# Patient Record
Sex: Male | Born: 1965
Health system: Southern US, Community
[De-identification: ages and names within clinical notes are randomized; demographics above are authoritative.]

## PROBLEM LIST (undated history)

## (undated) DIAGNOSIS — E785 Hyperlipidemia, unspecified: Secondary | ICD-10-CM

## (undated) DIAGNOSIS — B019 Varicella without complication: Secondary | ICD-10-CM

## (undated) DIAGNOSIS — I82409 Acute embolism and thrombosis of unspecified deep veins of unspecified lower extremity: Secondary | ICD-10-CM

## (undated) HISTORY — DX: Hyperlipidemia, unspecified: E78.5

## (undated) HISTORY — PX: CIRCUMCISION: SUR203

## (undated) HISTORY — DX: Varicella without complication: B01.9

## (undated) HISTORY — DX: Acute embolism and thrombosis of unspecified deep veins of unspecified lower extremity: I82.409

## (undated) HISTORY — PX: APPENDECTOMY: SHX54

---

## 2003-06-20 ENCOUNTER — Emergency Department (HOSPITAL_COMMUNITY): Admission: EM | Admit: 2003-06-20 | Discharge: 2003-06-20 | Payer: Self-pay | Admitting: Emergency Medicine

## 2011-03-15 ENCOUNTER — Emergency Department (HOSPITAL_COMMUNITY)
Admission: EM | Admit: 2011-03-15 | Discharge: 2011-03-15 | Disposition: A | Discharge status: Home / Self Care | Payer: BC Managed Care – PPO | Attending: Emergency Medicine | Admitting: Emergency Medicine

## 2011-03-15 ENCOUNTER — Encounter: Payer: Self-pay | Admitting: *Deleted

## 2011-03-15 ENCOUNTER — Emergency Department (HOSPITAL_COMMUNITY): Payer: BC Managed Care – PPO

## 2011-03-15 DIAGNOSIS — I880 Nonspecific mesenteric lymphadenitis: Secondary | ICD-10-CM | POA: Insufficient documentation

## 2011-03-15 DIAGNOSIS — R1031 Right lower quadrant pain: Secondary | ICD-10-CM | POA: Insufficient documentation

## 2011-03-15 DIAGNOSIS — R110 Nausea: Secondary | ICD-10-CM | POA: Insufficient documentation

## 2011-03-15 DIAGNOSIS — R10813 Right lower quadrant abdominal tenderness: Secondary | ICD-10-CM | POA: Insufficient documentation

## 2011-03-15 LAB — DIFFERENTIAL
Basophils Absolute: 0 10*3/uL (ref 0.0–0.1)
Basophils Relative: 0 % (ref 0–1)
Eosinophils Absolute: 0.3 10*3/uL (ref 0.0–0.7)
Eosinophils Relative: 6 % — ABNORMAL HIGH (ref 0–5)
Lymphocytes Relative: 37 % (ref 12–46)
Lymphs Abs: 1.7 10*3/uL (ref 0.7–4.0)
Monocytes Absolute: 0.4 10*3/uL (ref 0.1–1.0)
Monocytes Relative: 9 % (ref 3–12)
Neutro Abs: 2.2 10*3/uL (ref 1.7–7.7)
Neutrophils Relative %: 48 % (ref 43–77)

## 2011-03-15 LAB — URINALYSIS, ROUTINE W REFLEX MICROSCOPIC
Bilirubin Urine: NEGATIVE
Glucose, UA: NEGATIVE mg/dL
Hgb urine dipstick: NEGATIVE
Ketones, ur: NEGATIVE mg/dL
Leukocytes, UA: NEGATIVE
Nitrite: NEGATIVE
Protein, ur: NEGATIVE mg/dL
Specific Gravity, Urine: 1.027 (ref 1.005–1.030)
Urobilinogen, UA: 0.2 mg/dL (ref 0.0–1.0)
pH: 6 (ref 5.0–8.0)

## 2011-03-15 LAB — HEPATIC FUNCTION PANEL
ALT: 17 U/L (ref 0–53)
AST: 16 U/L (ref 0–37)
Albumin: 3.7 g/dL (ref 3.5–5.2)
Alkaline Phosphatase: 47 U/L (ref 39–117)
Bilirubin, Direct: 0.2 mg/dL (ref 0.0–0.3)
Indirect Bilirubin: 0.6 mg/dL (ref 0.3–0.9)
Total Bilirubin: 0.8 mg/dL (ref 0.3–1.2)
Total Protein: 7.6 g/dL (ref 6.0–8.3)

## 2011-03-15 LAB — CBC
HCT: 47 % (ref 39.0–52.0)
Hemoglobin: 16.4 g/dL (ref 13.0–17.0)
MCH: 29 pg (ref 26.0–34.0)
MCHC: 34.9 g/dL (ref 30.0–36.0)
MCV: 83.2 fL (ref 78.0–100.0)
Platelets: 192 10*3/uL (ref 150–400)
RBC: 5.65 MIL/uL (ref 4.22–5.81)
RDW: 13.2 % (ref 11.5–15.5)
WBC: 4.6 10*3/uL (ref 4.0–10.5)

## 2011-03-15 LAB — BASIC METABOLIC PANEL
BUN: 13 mg/dL (ref 6–23)
CO2: 25 mEq/L (ref 19–32)
Calcium: 9.3 mg/dL (ref 8.4–10.5)
Chloride: 104 mEq/L (ref 96–112)
Creatinine, Ser: 1.11 mg/dL (ref 0.50–1.35)
GFR calc Af Amer: >90 mL/min (ref >90)
GFR calc non Af Amer: 80 mL/min — ABNORMAL LOW (ref >90)
Glucose, Bld: 91 mg/dL (ref 70–99)
Potassium: 3.9 mEq/L (ref 3.5–5.1)
Sodium: 139 mEq/L (ref 135–145)

## 2011-03-15 LAB — LIPASE, BLOOD: Lipase: 21 U/L (ref 11–59)

## 2011-03-15 MED ORDER — MORPHINE SULFATE 4 MG/ML IJ SOLN
4.0000 mg | Freq: Once | INTRAMUSCULAR | Status: AC
  Administered 2011-03-15: 4 mg via INTRAVENOUS
  Filled 2011-03-15: qty 1

## 2011-03-15 MED ORDER — ONDANSETRON HCL 4 MG/2ML IJ SOLN
4.0000 mg | Freq: Once | INTRAMUSCULAR | Status: AC
  Administered 2011-03-15: 4 mg via INTRAVENOUS
  Filled 2011-03-15: qty 2

## 2011-03-15 MED ORDER — HYDROCODONE-ACETAMINOPHEN 5-325 MG PO TABS: 1.0000 | ORAL_TABLET | Freq: Four times a day (QID) | ORAL | Status: AC | PRN

## 2011-03-15 MED ORDER — IOHEXOL 300 MG/ML  SOLN
100.0000 mL | Freq: Once | INTRAMUSCULAR | Status: AC | PRN
  Administered 2011-03-15: 100 mL via INTRAVENOUS

## 2011-03-15 NOTE — ED Notes (Signed)
Pt awaiting ct results. C/o rlq abd pain with nausea since Sunday. Pt states pain tolerable presently. Pt in no acute distress. A&ox3.

## 2011-03-15 NOTE — ED Notes (Signed)
Pt reports progressively worsening RLQ pain. Denies urinary symptoms. Pt reports pain increases with movements and pressing. Pt denies any fevers. Wife states pt has been very sluggish.

## 2011-03-15 NOTE — ED Notes (Signed)
Returned from ct 

## 2011-03-15 NOTE — ED Notes (Signed)
Report given to CDU. Pt in CT at present and will go to CDU after.

## 2011-03-15 NOTE — ED Provider Notes (Signed)
Patient care resumed from [facility].  Patient presented with right lower Cordran pain but denies fevers, night sweats, chills.  Patient's pain was being managed in the emergency department with morphine and was sent to the CDU was CT pended to rule out appendicitis.  CT results are listed below.  The patient does not have an appendix and there were no signs of acute emergent findings.  Patient discharged with findings that could indicate mesenteric adenitis.  Discussed findings of CT exam thoroughly with patient including the need to followup for his pulmonary nodules 7 mm in his right lung base.  Patient understands that it is recommended he followup for a chest CT in 6 months time.  Patient is currently hemodynamically in no acute distress and has no complaints.    IMPRESSION: There are. Postoperative changes of appendectomy. No inflammatory changes are demonstrated in the right lower quadrant. Mild prominence of nonpathologically enlarged mesenteric and right lower quadrant lymph nodes could reflect mesenteric adenitis although nonspecific. 7 mm indeterminate nodule in the right lung base. See follow-up recommendations above. Flattening of the inferior vena cava suggesting dehydration.  Original Report Authenticated By: Marlon Pel, M.D.         [place], [place] 03/15/11 2243

## 2011-03-15 NOTE — ED Provider Notes (Signed)
History     CSN: [id number]  Arrival date & time 03/15/11  1647   First MD Initiated Contact with Patient 03/15/11 1811      Chief Complaint  Patient presents with  . Abdominal Pain   Patient is a 46 y.o. male presenting with abdominal pain.  Abdominal Pain The primary symptoms of the illness include abdominal pain and nausea. The primary symptoms of the illness do not include fever, shortness of breath, vomiting, diarrhea or dysuria.  Symptoms associated with the illness do not include chills, diaphoresis, constipation, urgency, hematuria or back pain.   Patient presents to the emergency room with complaint of abdominal pain, RLQ that has been constant in nature for the past four days. Reports some associated nausea. No fevers. No vomiting. Normal bowel movement this morning, without any blood in his stool. Denies any diarrhea. Denies any pain similar to this. Denies any RUQ pain. Patient has had umbilical hernia repair surgery when he was younger, no recent abdominal surgeries. Denies any hernia, urethral discharge, or dysuria. Denies any trauma.   History reviewed. No pertinent past medical history.  History reviewed. No pertinent past surgical history.  History reviewed. No pertinent family history.  History  Substance Use Topics  . Smoking status: Never Smoker   . Smokeless tobacco: Not on file  . Alcohol Use: No      Review of Systems  Constitutional: Negative for fever, chills, diaphoresis and appetite change.  HENT: Negative for neck pain.   Eyes: Negative for photophobia and visual disturbance.  Respiratory: Negative for cough, chest tightness and shortness of breath.   Cardiovascular: Negative for chest pain.  Gastrointestinal: Positive for nausea and abdominal pain. Negative for vomiting, diarrhea, constipation, blood in stool, abdominal distention, anal bleeding and rectal pain.  Genitourinary: Negative for dysuria, urgency, hematuria, flank pain, decreased urine  volume, penile swelling, scrotal swelling, difficulty urinating and testicular pain.  Musculoskeletal: Negative for back pain.  Skin: Negative for rash.  Neurological: Negative for weakness and numbness.  All other systems reviewed and are negative.    Allergies  Review of patient's allergies indicates no known allergies.  Home Medications  No current outpatient prescriptions on file.  BP 114/75  Pulse 71  Temp(Src) 97.5 F (36.4 C) (Oral)  Resp 18  SpO2 98%  Physical Exam  Nursing note and vitals reviewed. Constitutional: He is oriented to person, place, and time. He appears well-developed and well-nourished. No distress.  HENT:  Head: Normocephalic and atraumatic.  Mouth/Throat: Oropharynx is clear and moist.  Eyes: EOM are normal. Pupils are equal, round, and reactive to light.  Neck: Normal range of motion. Neck supple.  Cardiovascular: Normal rate, regular rhythm, normal heart sounds and intact distal pulses.  Exam reveals no gallop and no friction rub.   No murmur heard. Pulmonary/Chest: Effort normal and breath sounds normal. No respiratory distress. He has no wheezes. He exhibits no tenderness.  Abdominal: Soft. Bowel sounds are normal. He exhibits no mass. There is tenderness. There is no rebound and no guarding.       RLQ pain, tender to palpation. Negative murphy's sign. Umbilical hernia repair scar.   Musculoskeletal: Normal range of motion. He exhibits no edema and no tenderness.  Neurological: He is alert and oriented to person, place, and time. He displays normal reflexes. No cranial nerve deficit or sensory deficit. He exhibits normal muscle tone. He displays a negative Romberg sign. Coordination and gait normal. GCS eye subscore is 4. GCS verbal subscore is  5. GCS motor subscore is 6. He displays no Babinski's sign on the right side. He displays no Babinski's sign on the left side.       Normal finger to nose testing. Normal grip. No pronator drift. No nystagmus  on examination.  Skin: Skin is warm and dry. No rash noted. He is not diaphoretic. No erythema. No pallor.  Psychiatric: He has a normal mood and affect. His behavior is normal. Judgment and thought content normal.    ED Course  Procedures (including critical care time)  Patient seen and evaluated.  VSS reviewed. . Nursing notes reviewed.  Initial testing ordered. Will monitor the patient closely. They agree with the treatment plan and diagnosis.  BP 114/75  Pulse 71  Temp(Src) 97.5 F (36.4 C) (Oral)  Resp 18  SpO2 98%  Patient resting comfortably. Pain to palpation. Pending ct scan to r/o appendicitis.   8:35 PM patient drinking contrast without problems. Abdomen is not rigid. No rebound tenderness. Pending ct abdomen/pelvis. Discussed with Earlie Lou who will assume care MDM  Abdominal pain        Demetrius Charity, [place] 03/15/11 2036

## 2011-03-15 NOTE — ED Notes (Signed)
Patient transported to CT 

## 2011-03-16 NOTE — ED Provider Notes (Signed)
Medical screening examination/treatment/procedure(s) were performed by non-physician practitioner and as supervising physician I was immediately available for consultation/collaboration.  Jeanne Terrance, MD 03/16/11 1511 

## 2011-03-16 NOTE — ED Provider Notes (Signed)
Medical screening examination/treatment/procedure(s) were performed by non-physician practitioner and as supervising physician I was immediately available for consultation/collaboration.  Jadwiga Faidley, MD 03/16/11 1511 

## 2011-07-19 ENCOUNTER — Ambulatory Visit (INDEPENDENT_AMBULATORY_CARE_PROVIDER_SITE_OTHER): Payer: BC Managed Care – PPO | Admitting: Physician Assistant

## 2011-07-19 VITALS — BP 113/74 | HR 79 | Temp 97.9°F | Resp 16 | Ht 72.25 in | Wt 238.0 lb

## 2011-07-19 DIAGNOSIS — J020 Streptococcal pharyngitis: Secondary | ICD-10-CM

## 2011-07-19 DIAGNOSIS — J029 Acute pharyngitis, unspecified: Secondary | ICD-10-CM

## 2011-07-19 LAB — POCT RAPID STREP A (OFFICE): Rapid Strep A Screen: NEGATIVE

## 2011-07-19 MED ORDER — DIPHENHYD-HYDROCORT-NYSTATIN MT SUSP: 5.0000 mL | Freq: Four times a day (QID) | OROMUCOSAL | Status: DC | PRN

## 2011-07-19 MED ORDER — AZITHROMYCIN 250 MG PO TABS
ORAL_TABLET | ORAL | Status: AC
Start: 2011-07-19 — End: 2011-07-24

## 2011-07-19 NOTE — Progress Notes (Signed)
    Patient ID: Gregory Montgomery MRN: [id number], DOB: 10/16/1965, 46 y.o. Date of Encounter: 07/19/2011, 7:22 PM  Primary Physician: Tally Due, MD, MD  Chief Complaint:  Chief Complaint  Patient presents with  . Sore Throat    HPI: 46 y.o. year old male presents with a 1 day history of sore throat. No fever or chills. No cough, congestion, rhinorrhea, sinus pressure, otalgia, or headache. Normal hearing. No GI complaints. Able to swallow saliva, but hurts to do so. Decreased appetite secondary to sore throat.   No past medical history on file.   Home Meds: Prior to Admission medications   Not on File    Allergies: No Known Allergies  History   Social History  . Marital Status: Married    Spouse Name: N/A    Number of Children: N/A  . Years of Education: N/A   Occupational History  . Not on file.   Social History Main Topics  . Smoking status: Never Smoker   . Smokeless tobacco: Not on file  . Alcohol Use: No  . Drug Use:   . Sexually Active:    Other Topics Concern  . Not on file   Social History Narrative  . No narrative on file     Review of Systems: Constitutional: negative for chills, fever, night sweats or weight changes HEENT: see above Cardiovascular: negative for chest pain or palpitations Respiratory: negative for hemoptysis, wheezing, or shortness of breath Abdominal: negative for abdominal pain, nausea, vomiting or diarrhea Dermatological: negative for rash Neurologic: negative for headache   Physical Exam: Blood pressure 113/74, pulse 79, temperature 97.9 F (36.6 C), temperature source Oral, resp. rate 16, height 6' 0.25" (1.835 m), weight 238 lb (107.956 kg)., Body mass index is 32.06 kg/(m^2). General: Well developed, well nourished, in no acute distress. Head: Normocephalic, atraumatic, eyes without discharge, sclera non-icteric, nares are patent. Bilateral auditory canals clear, TM's are without perforation, pearly grey with  reflective cone of light bilaterally. No sinus TTP. Oral cavity moist, dentition normal. Posterior pharynx with post nasal drip and mild erythema. No peritonsillar abscess or tonsillar exudate. Neck: Supple. No thyromegaly. Full ROM. No lymphadenopathy. Lungs: Clear bilaterally to auscultation without wheezes, rales, or rhonchi. Breathing is unlabored. Heart: RRR with S1 S2. No murmurs, rubs, or gallops appreciated. Msk:  Strength and tone normal for age. Extremities: No clubbing or cyanosis. No edema. Neuro: Alert and oriented X 3. Moves all extremities spontaneously. CNII-XII grossly in tact. Psych:  Responds to questions appropriately with a normal affect.   Labs: Results for orders placed in visit on 07/19/11  POCT RAPID STREP A (OFFICE)      Component Value Range   Rapid Strep A Screen Negative  Negative    TC pending  ASSESSMENT AND PLAN:  46 y.o. year old male with likely strep pharyngitis -Azithromycin 250 MG #6 2 po first day then 1 po next 4 days no RF -DMM -Tylenol/Motrin prn -TC pending -Rest/fluids -RTC precautions -RTC 3-5 days if no improvement  Signed, Eula Listen, PA-C 07/19/2011 7:22 PM

## 2011-07-20 ENCOUNTER — Telehealth: Payer: Self-pay

## 2011-07-20 DIAGNOSIS — R5381 Other malaise: Secondary | ICD-10-CM

## 2011-07-20 NOTE — Telephone Encounter (Signed)
Pt came in office this evening and would like referral to a sleep clinic for sleep apnea.  No preference.  Saw Gregory Montgomery on 07/19/11 and forgot to get referral.

## 2011-07-21 LAB — CULTURE, GROUP A STREP: Organism ID, Bacteria: NORMAL

## 2011-07-21 NOTE — Progress Notes (Signed)
Quick Note:  Await final results.  Eula Listen, PA-C 07/21/2011 11:27 AM ______

## 2011-07-21 NOTE — Telephone Encounter (Signed)
Patient was only seen for a sore throat. But we can set up referral.

## 2012-05-11 ENCOUNTER — Ambulatory Visit (INDEPENDENT_AMBULATORY_CARE_PROVIDER_SITE_OTHER): Payer: BC Managed Care – PPO | Admitting: Internal Medicine

## 2012-05-11 ENCOUNTER — Encounter: Payer: Self-pay | Admitting: Internal Medicine

## 2012-05-11 VITALS — BP 115/79 | HR 80 | Temp 97.8°F | Resp 18 | Ht 74.0 in | Wt 234.0 lb

## 2012-05-11 DIAGNOSIS — J209 Acute bronchitis, unspecified: Secondary | ICD-10-CM

## 2012-05-11 MED ORDER — HYDROCODONE-ACETAMINOPHEN 7.5-325 MG/15ML PO SOLN: 5.0000 mL | Freq: Four times a day (QID) | ORAL | Status: DC | PRN

## 2012-05-11 MED ORDER — AZITHROMYCIN 500 MG PO TABS: 500.0000 mg | ORAL_TABLET | Freq: Every day | ORAL | Status: DC

## 2012-05-11 NOTE — Progress Notes (Signed)
  Subjective:    Patient ID: Gregory Montgomery, male    DOB: Sep 12, 1965, 47 y.o.   MRN: [id number]  HPI Sick for 1-2 weeks with respiratory illness. Is better but cough persists. Sputum is brown. No sob,cp,or blood seen. No smoke or drink, is fit and exercises.   Review of Systems     Objective:   Physical Exam  Vitals reviewed. Constitutional: He appears well-developed and well-nourished.  HENT:  Right Ear: External ear normal.  Left Ear: External ear normal.  Nose: Mucosal edema, rhinorrhea and sinus tenderness present. Right sinus exhibits no maxillary sinus tenderness and no frontal sinus tenderness. Left sinus exhibits no maxillary sinus tenderness and no frontal sinus tenderness.  Mouth/Throat: Oropharynx is clear and moist.  Eyes: EOM are normal.  Cardiovascular: Normal rate and normal heart sounds.   Pulmonary/Chest: Effort normal and breath sounds normal. No respiratory distress. He has no wheezes. He has no rales. He exhibits no tenderness.          Assessment & Plan:  Bronchitis Zithromx 500mg /Lortab CXR/CBC if not better

## 2012-05-11 NOTE — Patient Instructions (Signed)

## 2012-09-12 IMAGING — CT CT ABD-PELV W/ CM
2 of 5 series · 16 of 46 positions shown, 18 images · IV contrast (APPLIED)
Comparison: None.

CLINICAL DATA: Worsening right lower quadrant pain over 3 days.
Nausea.

CT ABDOMEN AND PELVIS WITH CONTRAST
TECHNIQUE: Multidetector CT imaging of the abdomen and pelvis was
performed following the standard protocol during bolus
administration of intravenous contrast.
Contrast: 100mL OMNIPAQUE IOHEXOL 300 MG/ML IV SOLN

[Series 2: abd/pelv with 5.0 b31f st · axial · 0.66mm/px · z∈[+356,+780]mm · 13 of 96 slices shown, 15 images]
[im 6/96  soft-tissue]
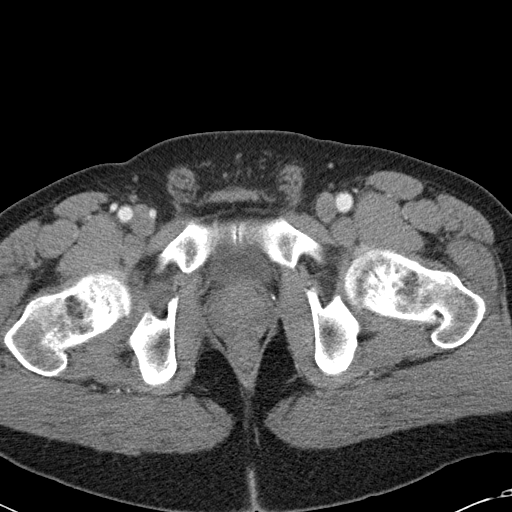
[im 6/96  bone]
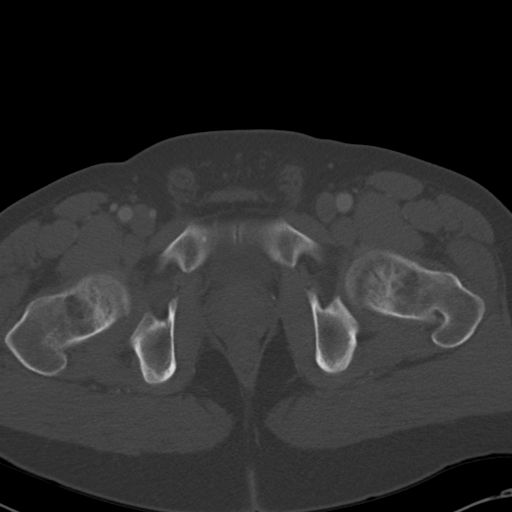
[im 16/96  soft-tissue]
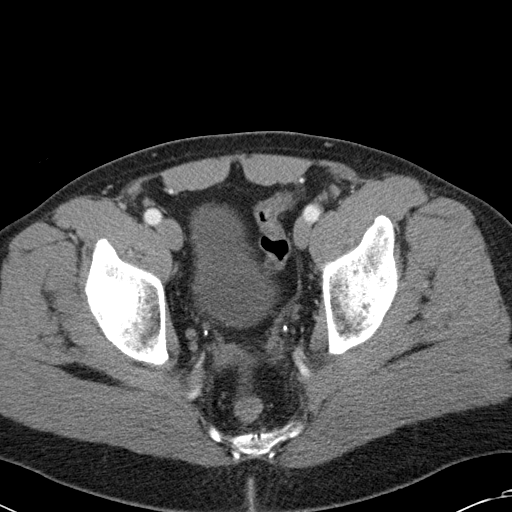
[im 21/96  soft-tissue]
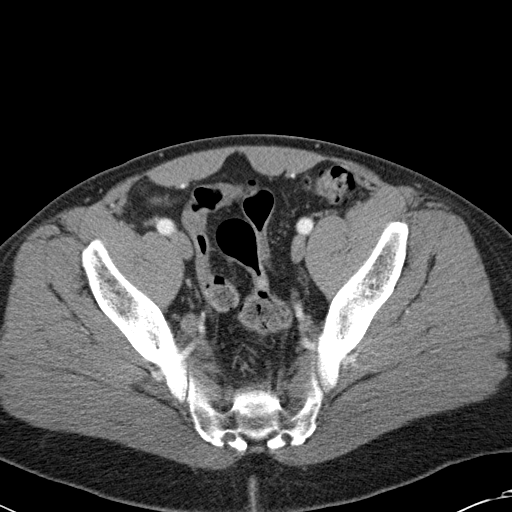
[im 26/96  soft-tissue]
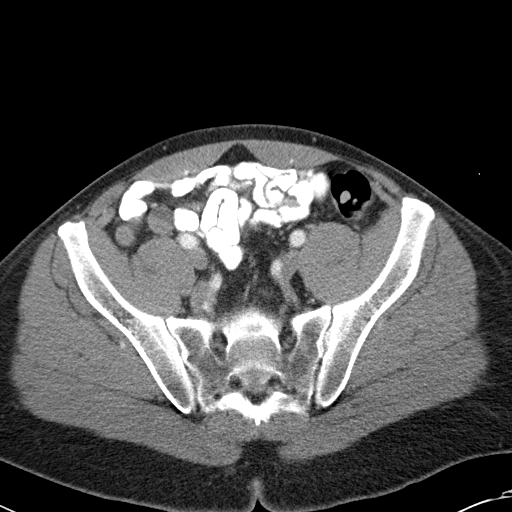
[im 36/96  soft-tissue]
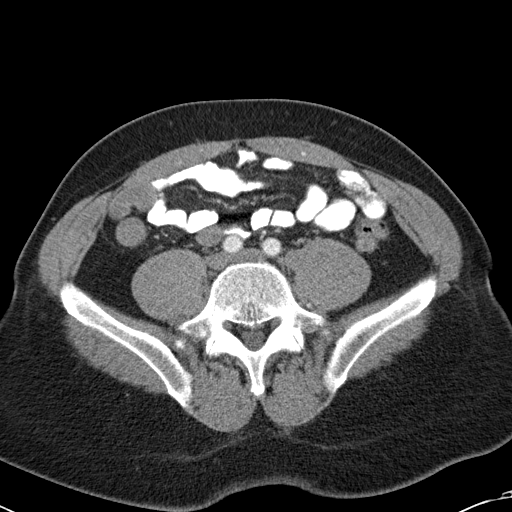
[im 41/96  soft-tissue]
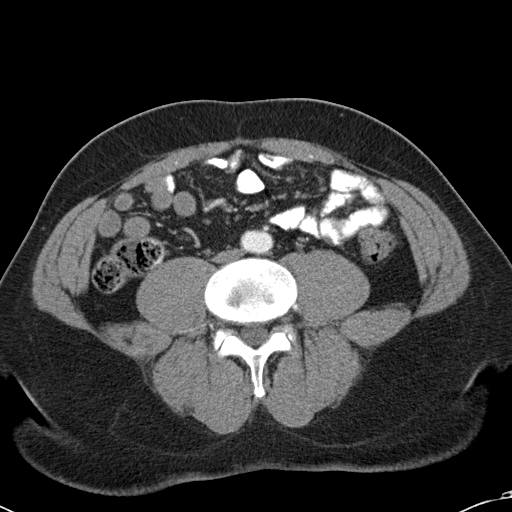
[im 51/96  soft-tissue]
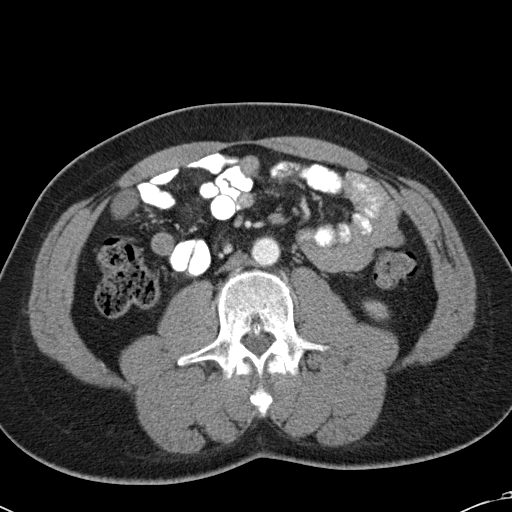
[im 56/96  soft-tissue]
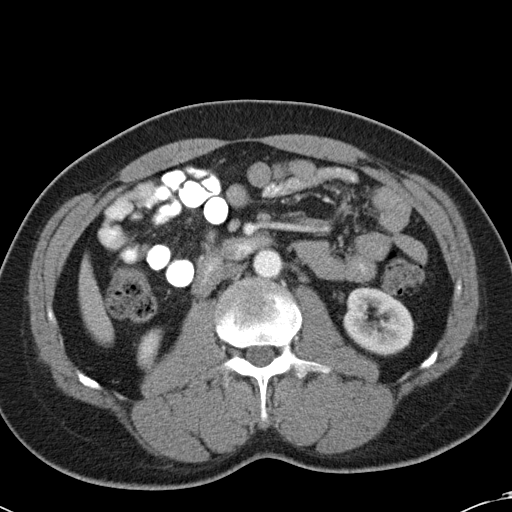
[im 61/96  soft-tissue]
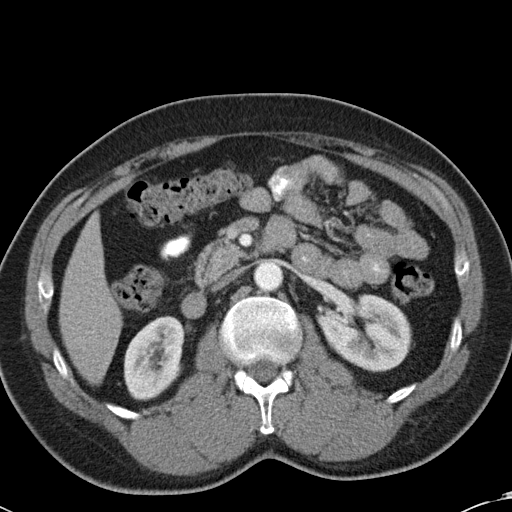
[im 61/96  bone]
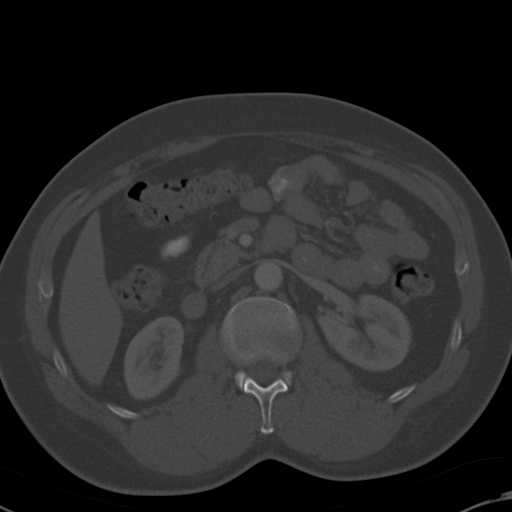
[im 71/96  soft-tissue]
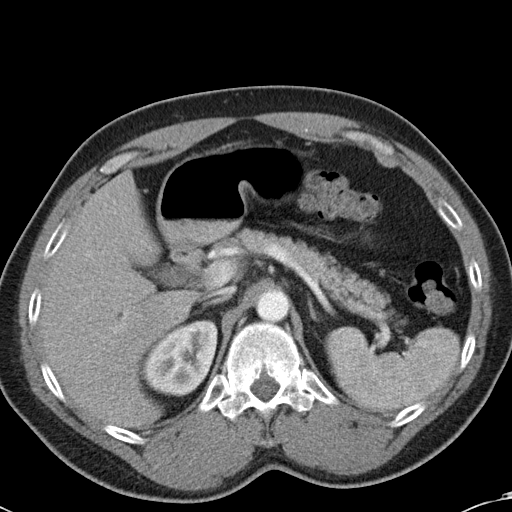
[im 76/96  soft-tissue]
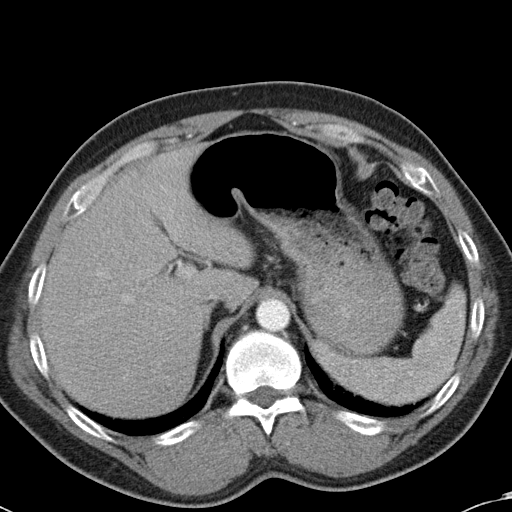
[im 81/96  soft-tissue]
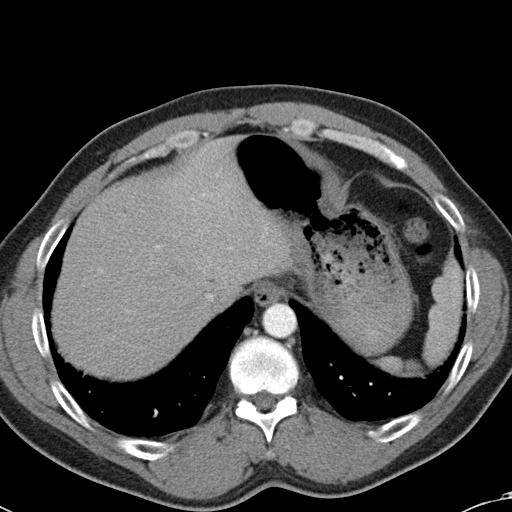
[im 91/96  soft-tissue]
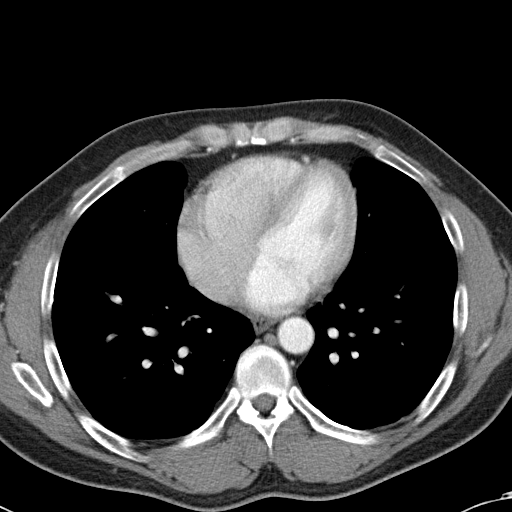

[Series 602: cor · coronal · 0.94mm/px · 3 of 128 slices shown]
[im 43/128  soft-tissue]
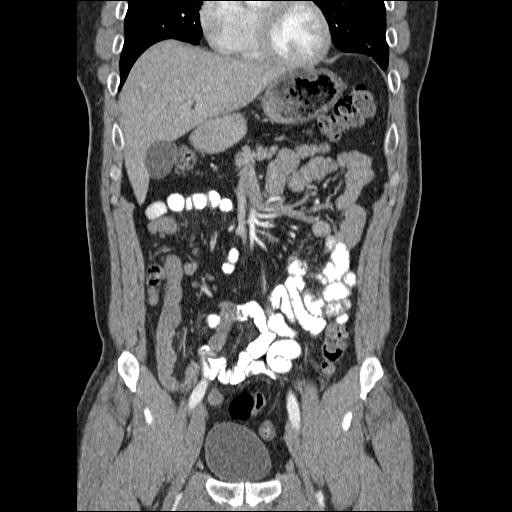
[im 57/128  soft-tissue]
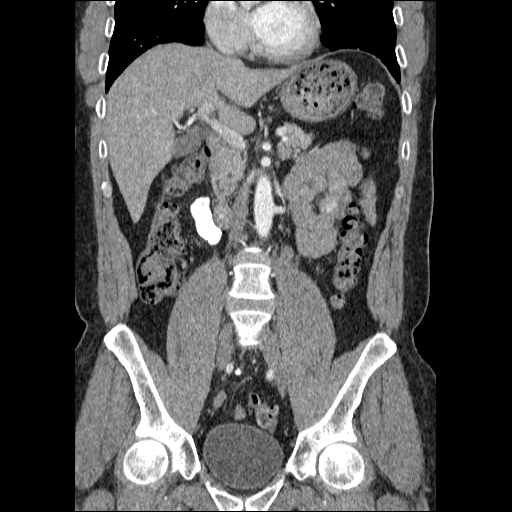
[im 71/128  soft-tissue]
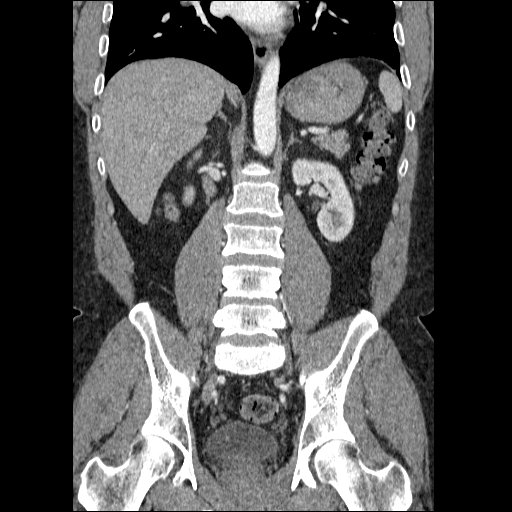

[16 of 46 positions shown; findings below may reference images not displayed]

FINDINGS: Mild dependent atelectasis in the lung bases.  Focal
subpleural nodular opacity measuring about 7 mm diameter.  This is
not calcified. If the patient is at high risk for bronchogenic
carcinoma, follow-up chest CT at 3-6 months is recommended.  If the
patient is at low risk for bronchogenic carcinoma, follow-up chest
CT at 6-12 months is recommended.  This recommendation follows the
consensus statement: Guidelines for Management of Small Pulmonary
Nodules Detected on CT Scans: A Statement from the [employer]
[URL]

The liver, spleen, gallbladder, pancreas, adrenal glands, kidneys,
abdominal aorta, and retroperitoneal lymph nodes are unremarkable.
There is flattening of the inferior vena cava suggesting
dehydration.  The gastric wall is not thickened.  Small bowel are
decompressed.  No free fluid or free air in the abdomen.

Pelvis:  The appendix is not demonstrated and there are surgical
staples demonstrated at the cecal tip suggesting previous
appendectomy. No inflammatory changes in the right lower quadrant.
Mesenteric and right lower quadrant lymph nodes are moderately
prominent but not pathologically enlarged suggesting reactive
nodes.  No free or loculated pelvic fluid collections.  The
prostate gland is not enlarged.  The bladder wall is not thickened.
No inflammatory changes involving the sigmoid colon.
Calcifications in the pelvis consistent with phleboliths.  Normal
alignment of the lumbar vertebrae.
IMPRESSION: There are.  Postoperative changes of appendectomy.  No inflammatory
changes are demonstrated in the right lower quadrant.  Mild
prominence of nonpathologically enlarged mesenteric and right lower
quadrant lymph nodes could reflect mesenteric adenitis although
nonspecific.  7 mm indeterminate nodule in the right lung base.
See follow-up recommendations above.  Flattening of the inferior
vena cava suggesting dehydration.

## 2012-09-23 ENCOUNTER — Ambulatory Visit: Payer: Self-pay | Admitting: Emergency Medicine

## 2012-09-23 VITALS — BP 120/82 | HR 82 | Temp 98.2°F | Resp 18 | Ht 74.5 in | Wt 230.0 lb

## 2012-09-23 DIAGNOSIS — Z0289 Encounter for other administrative examinations: Secondary | ICD-10-CM

## 2012-09-23 NOTE — Progress Notes (Signed)
[facility] [address], [place] [address] [phone]  Date:  09/23/2012   Name:  Amiir Heckard   DOB:  Jul 05, 1965   MRN:  [id number]  PCP:  Tally Due, MD    Chief Complaint: DOT physical   History of Present Illness:  Wilkes Potvin is a 47 y.o. very pleasant male patient who presents with the following:  DOT certification  There are no active problems to display for this patient.   History reviewed. No pertinent past medical history.  Past Surgical History  Procedure Laterality Date  . Appendectomy      History  Substance Use Topics  . Smoking status: Never Smoker   . Smokeless tobacco: Not on file  . Alcohol Use: No    Family History  Problem Relation Age of Onset  . Stroke Mother   . Allergies Daughter     No Known Allergies  Medication list has been reviewed and updated.  Current Outpatient Prescriptions on File Prior to Visit  Medication Sig Dispense Refill  . azithromycin (ZITHROMAX) 500 MG tablet Take 1 tablet (500 mg total) by mouth daily.  5 tablet  0  . Diphenhyd-Hydrocort-Nystatin SUSP Use as directed 5 mLs in the mouth or throat 4 (four) times daily as needed.  120 mL  0  . HYDROcodone-acetaminophen (HYCET) 7.5-325 mg/15 ml solution Take 5 mLs by mouth every 6 (six) hours as needed for pain (or cough).  240 mL  0   No current facility-administered medications on file prior to visit.    Review of Systems:  As per HPI, otherwise negative.    Physical Examination: Filed Vitals:   09/23/12 1841  BP: 120/82  Pulse: 82  Temp: 98.2 F (36.8 C)  Resp: 18   Filed Vitals:   09/23/12 1841  Height: 6' 2.5" (1.892 m)  Weight: 230 lb (104.327 kg)   Body mass index is 29.14 kg/(m^2). Ideal Body Weight: Weight in (lb) to have BMI = 25: 196.9  GEN: WDWN, NAD, Non-toxic, A & O x 3 HEENT: Atraumatic, Normocephalic. Neck supple. No masses, No LAD. Ears and Nose: No external deformity. CV: RRR, No M/G/R. No JVD.  No thrill. No extra heart sounds. PULM: CTA B, no wheezes, crackles, rhonchi. No retractions. No resp. distress. No accessory muscle use. ABD: S, NT, ND, +BS. No rebound. No HSM. EXTR: No c/c/e NEURO Normal gait.  PSYCH: Normally interactive. Conversant. Not depressed or anxious appearing.  Calm demeanor.    Assessment and Plan: Paperwork completed   Signed,  Phillips Odor, MD

## 2012-09-26 ENCOUNTER — Encounter: Payer: Self-pay | Admitting: Emergency Medicine

## 2013-02-24 ENCOUNTER — Ambulatory Visit (INDEPENDENT_AMBULATORY_CARE_PROVIDER_SITE_OTHER): Payer: BC Managed Care – PPO | Admitting: Emergency Medicine

## 2013-02-24 VITALS — BP 124/88 | HR 86 | Temp 98.3°F | Resp 18 | Ht 73.0 in | Wt 233.0 lb

## 2013-02-24 DIAGNOSIS — N529 Male erectile dysfunction, unspecified: Secondary | ICD-10-CM

## 2013-02-24 MED ORDER — VARDENAFIL HCL 20 MG PO TABS: 20.0000 mg | ORAL_TABLET | Freq: Every day | ORAL | Status: DC | PRN

## 2013-02-24 NOTE — Patient Instructions (Signed)
Erectile Dysfunction  Erectile dysfunction is the inability to get or sustain a good enough erection to have sexual intercourse. Erectile dysfunction may involve:   Inability to get an erection.   Lack of enough hardness to allow penetration.   Loss of the erection before sex is finished.   Premature ejaculation.  CAUSES   Certain drugs, such as:   Pain relievers.   Antihistamines.   Antidepressants.   Blood pressure medicines.   Water pills (diuretics).   Ulcer medicines.   Muscle relaxants.   Illegal drugs.   Excessive drinking.   Psychological causes, such as:   Anxiety.   Depression.   Sadness.   Exhaustion.   Performance fear.   Stress.   Physical causes, such as:   Artery problems. This may include diabetes, smoking, liver disease, or atherosclerosis.   High blood pressure.   Hormonal problems, such as low testosterone.   Obesity.   Nerve problems. This may include back or pelvic injuries, diabetes mellitus, multiple sclerosis, or Parkinson disease.  SYMPTOMS   Inability to get an erection.   Lack of enough hardness to allow penetration.   Loss of the erection before sex is finished.   Premature ejaculation.   Normal erections at some times, but with frequent unsatisfactory episodes.   Orgasms that are not satisfactory in sensation or frequency.   Low sexual satisfaction in either partner because of erection problems.   A curved penis occurring with erection. The curve may cause pain or may be too curved to allow for intercourse.   Never having nighttime erections.  DIAGNOSIS  Your caregiver can often diagnose this condition by:   Performing a physical exam to find other diseases or specific problems with the penis.   Asking you detailed questions about the problem.   Performing blood tests to check for diabetes mellitus or to measure hormone levels.   Performing urine tests to find other underlying health conditions.   Performing an ultrasound exam to check for  scarring.   Performing a test to check blood flow to the penis.   Doing a sleep study at home to measure nighttime erections.  TREATMENT    You may be prescribed medicines by mouth.   You may be given medicine injections into the penis.   You may be prescribed a vacuum pump with a ring.   Penile implant surgery may be performed. You may receive:   An inflatable implant.   A semirigid implant.   Blood vessel surgery may be performed.  HOME CARE INSTRUCTIONS   If you are prescribed oral medicine, you should take the medicine as prescribed. Do not increase the dosage without first discussing it with your physician.   If you are using self-injections, be careful to avoid any veins that are on the surface of the penis. Apply pressure to the injection site for 5 minutes.   If you are using a vacuum pump, make sure you have read the instructions before using it. Discuss any questions with your physician before taking the pump home.  SEEK MEDICAL CARE IF:   You experience pain that is not responsive to the pain medicine you have been prescribed.   You experience nausea or vomiting.  SEEK IMMEDIATE MEDICAL CARE IF:    When taking oral or injectable medications, you experience an erection that lasts longer than 4 hours. If your physician is unavailable, go to the nearest emergency room for evaluation. An erection that lasts much longer than 4 hours can   result in permanent damage to your penis.   You have pain that is severe.   You develop redness, severe pain, or severe swelling of your penis.   You have redness spreading up into your groin or lower abdomen.   You are unable to pass your urine.  Document Released: 02/25/2000 Document Revised: 10/30/2012 Document Reviewed: 08/01/2012  [employer] Patient Information 2014 [employer].

## 2013-02-24 NOTE — Progress Notes (Signed)
[facility] [address] [place] [address] [phone]  Date:  02/24/2013   Name:  Gregory Montgomery   DOB:  1965-09-12   MRN:  [id number]  PCP:  Tally Due, MD    Chief Complaint: testosterone   History of Present Illness:  Gregory Montgomery is a 47 y.o. very pleasant male patient who presents with the following:  Has noticed over the past 6 months has noticed difficulty maintaining an erection.  Not on any medication.  Under a lot of stress.  Exercises regularly.  Not able to complete intercourse.  No improvement with over the counter medications or other home remedies. Denies other complaint or health concern today.   There are no active problems to display for this patient.   History reviewed. No pertinent past medical history.  Past Surgical History  Procedure Laterality Date  . Appendectomy      History  Substance Use Topics  . Smoking status: Never Smoker   . Smokeless tobacco: Not on file  . Alcohol Use: No    Family History  Problem Relation Age of Onset  . Stroke Mother   . Allergies Daughter     No Known Allergies  Medication list has been reviewed and updated.  Current Outpatient Prescriptions on File Prior to Visit  Medication Sig Dispense Refill  . azithromycin (ZITHROMAX) 500 MG tablet Take 1 tablet (500 mg total) by mouth daily.  5 tablet  0  . Diphenhyd-Hydrocort-Nystatin SUSP Use as directed 5 mLs in the mouth or throat 4 (four) times daily as needed.  120 mL  0  . HYDROcodone-acetaminophen (HYCET) 7.5-325 mg/15 ml solution Take 5 mLs by mouth every 6 (six) hours as needed for pain (or cough).  240 mL  0   No current facility-administered medications on file prior to visit.    Review of Systems:  As per HPI, otherwise negative.    Physical Examination: Filed Vitals:   02/24/13 1827  BP: 124/88  Pulse: 86  Temp: 98.3 F (36.8 C)  Resp: 18   Filed Vitals:   02/24/13 1827  Height: 6\' 1"  (1.854 m)  Weight:  233 lb (105.688 kg)   Body mass index is 30.75 kg/(m^2). Ideal Body Weight: Weight in (lb) to have BMI = 25: 189.1   GEN: WDWN, NAD, Non-toxic, Alert & Oriented x 3 HEENT: Atraumatic, Normocephalic.  Ears and Nose: No external deformity. EXTR: No clubbing/cyanosis/edema NEURO: Normal gait.  PSYCH: Normally interactive. Conversant. Not depressed or anxious appearing.  Calm demeanor.    Assessment and Plan: ED testoterone level levitra  Signed,  Phillips Odor, MD

## 2013-02-25 ENCOUNTER — Telehealth: Payer: Self-pay

## 2013-02-25 LAB — TESTOSTERONE: Testosterone: 367 ng/dL (ref 300–890)

## 2013-02-25 NOTE — Telephone Encounter (Signed)
Patient was here yesterday and when he got to pharmacy his medication was 300.00 please call him he cannot afford that [phone]

## 2013-02-25 NOTE — Telephone Encounter (Signed)
Levitra, anything else cheaper? Please advise.

## 2013-02-26 NOTE — Telephone Encounter (Signed)
There are no generics.  He can find out from the pharmacy what is preferred and let us know so we can prescribe that.

## 2013-02-27 ENCOUNTER — Telehealth: Payer: Self-pay | Admitting: *Deleted

## 2013-02-27 ENCOUNTER — Telehealth: Payer: Self-pay

## 2013-02-27 MED ORDER — TESTOSTERONE 10 MG/ACT (2%) TD GEL: 40.0000 mg | Freq: Every day | TRANSDERMAL | Status: DC

## 2013-02-27 MED ORDER — TESTOSTERONE 20.25 MG/ACT (1.62%) TD GEL: TRANSDERMAL | Status: DC

## 2013-02-27 NOTE — Telephone Encounter (Signed)
Rx for fortesta printed at [facility].

## 2013-02-27 NOTE — Telephone Encounter (Signed)
See notes under testosterone lab results. Rx put in for Androgel 1.62% gel, 4 pumps daily per Dr Ewell Poe order and called to pharm. Pharm stated that Androgel is very expensive, ins prefers fortesta, axiron, or androderm. The Azucena Freed would be cheaper than Axiron. I have cancelled Androgel Rx. Can we get Rx for Fortesta? Also pt stated that the Levitra is over $300. Advised pt to call ins and then let me know preferred brand of ED med.

## 2013-02-27 NOTE — Telephone Encounter (Signed)
Already completed.  See telephone note dated 12/18

## 2013-02-27 NOTE — Telephone Encounter (Signed)
Dr. Dareen Piano left a lab note on Labs dated 12/15 to send/call in a script for Androgel 4 pumps daily. What strength and quantity would be advised?

## 2013-02-28 ENCOUNTER — Other Ambulatory Visit: Payer: Self-pay | Admitting: Radiology

## 2013-12-02 ENCOUNTER — Encounter (HOSPITAL_COMMUNITY): Payer: BC Managed Care – PPO

## 2013-12-02 ENCOUNTER — Other Ambulatory Visit (HOSPITAL_COMMUNITY): Payer: Self-pay | Admitting: Sports Medicine

## 2013-12-02 DIAGNOSIS — M79605 Pain in left leg: Secondary | ICD-10-CM

## 2013-12-05 ENCOUNTER — Ambulatory Visit (HOSPITAL_COMMUNITY)
Admission: RE | Admit: 2013-12-05 | Discharge: 2013-12-05 | Disposition: A | Discharge status: Home / Self Care | Payer: BC Managed Care – PPO | Source: Ambulatory Visit | Attending: Cardiovascular Disease | Admitting: Cardiovascular Disease

## 2013-12-05 DIAGNOSIS — M79609 Pain in unspecified limb: Secondary | ICD-10-CM | POA: Insufficient documentation

## 2013-12-05 DIAGNOSIS — M79605 Pain in left leg: Secondary | ICD-10-CM

## 2013-12-05 NOTE — Progress Notes (Signed)
Left Lower Ext. Venous Duplex Completed. Negative for DVT or SVT in the left lower ext. Camdon Saetern, BS, RDMS, RVT  

## 2013-12-09 ENCOUNTER — Telehealth (HOSPITAL_COMMUNITY): Payer: Self-pay | Admitting: *Deleted

## 2013-12-10 ENCOUNTER — Telehealth (HOSPITAL_COMMUNITY): Payer: Self-pay | Admitting: *Deleted

## 2014-04-02 ENCOUNTER — Ambulatory Visit (INDEPENDENT_AMBULATORY_CARE_PROVIDER_SITE_OTHER): Payer: BLUE CROSS/BLUE SHIELD | Admitting: Physician Assistant

## 2014-04-02 VITALS — BP 128/88 | HR 68 | Temp 98.0°F | Resp 18 | Ht 74.0 in | Wt 248.0 lb

## 2014-04-02 DIAGNOSIS — J029 Acute pharyngitis, unspecified: Secondary | ICD-10-CM

## 2014-04-02 LAB — POCT RAPID STREP A (OFFICE): Rapid Strep A Screen: NEGATIVE

## 2014-04-02 MED ORDER — MAGIC MOUTHWASH W/LIDOCAINE: 10.0000 mL | ORAL | Status: DC | PRN

## 2014-04-02 NOTE — Progress Notes (Signed)
Reviewed and agree with treatment plan.

## 2014-04-02 NOTE — Progress Notes (Signed)
Subjective:    Patient ID: Gregory Montgomery, male    DOB: 1965-07-05, 49 y.o.   MRN: [id number]  HPI  This is a 49 year old male with no significant PMH who is presenting with sore throat x 1 day. He reports he woke up in the middle of the night and felt like there was "a frog in his throat". By the time he got to work he states his throat was very sore. He took some motrin and helped.He has a slight dry cough. He reports his co-worker is currently sick. He states 1 year ago he had strep throat. He denies fever, chills, nasal congestion, otalgia, abdominal pain, N/V/D. Has a slight dry cough. He is not a smoker and does not have a history of asthma.   Review of Systems  Constitutional: Negative for fever and chills.  HENT: Positive for sore throat. Negative for congestion, ear pain and sinus pressure.   Eyes: Negative for redness.  Respiratory: Positive for cough. Negative for shortness of breath and wheezing.   Gastrointestinal: Negative for nausea, vomiting, abdominal pain and diarrhea.  Skin: Negative for rash.  Neurological: Negative for headaches.  Hematological: Negative for adenopathy.    There are no active problems to display for this patient.  Prior to Admission medications   Not on File   No Known Allergies  Patient's social and family history were reviewed.     Objective:   Physical Exam  Constitutional: He is oriented to person, place, and time. He appears well-developed and well-nourished. No distress.  HENT:  Head: Normocephalic and atraumatic.  Right Ear: Hearing, tympanic membrane, external ear and ear canal normal.  Left Ear: Hearing, tympanic membrane, external ear and ear canal normal.  Nose: Nose normal.  Mouth/Throat: Uvula is midline and mucous membranes are normal. Oropharyngeal exudate and posterior oropharyngeal erythema present. No posterior oropharyngeal edema or tonsillar abscesses.  Eyes: Conjunctivae and lids are normal. Right eye exhibits no  discharge. Left eye exhibits no discharge. No scleral icterus.  Cardiovascular: Normal rate, regular rhythm, normal heart sounds, intact distal pulses and normal pulses.   No murmur heard. Pulmonary/Chest: Effort normal and breath sounds normal. No respiratory distress. He has no wheezes. He has no rhonchi. He has no rales.  Musculoskeletal: Normal range of motion.  Lymphadenopathy:       Head (right side): No submental, no submandibular and no tonsillar adenopathy present.       Head (left side): No submental, no submandibular and no tonsillar adenopathy present.    He has no cervical adenopathy.  Neurological: He is alert and oriented to person, place, and time.  Skin: Skin is warm, dry and intact. No lesion and no rash noted.  Psychiatric: He has a normal mood and affect. His speech is normal and behavior is normal. Thought content normal.   BP 128/88 mmHg  Pulse 68  Temp(Src) 98 F (36.7 C) (Oral)  Resp 18  Ht 6\' 2"  (1.88 m)  Wt 248 lb (112.492 kg)  BMI 31.83 kg/m2  SpO2 100%  Results for orders placed or performed in visit on 04/02/14  POCT rapid strep A  Result Value Ref Range   Rapid Strep A Screen Negative Negative      Assessment & Plan:  1. Sore throat 2. Acute pharyngitis, unspecified pharyngitis type Rapid strep negative, culture pending. This is likely viral. Magic mouthwash for symptoms. He will return in 5-7 days if symptoms are not improving.  - POCT rapid strep A -  Culture, Group A Strep - Alum & Mag Hydroxide-Simeth (MAGIC MOUTHWASH W/LIDOCAINE) SOLN; Take 10 mLs by mouth every 2 (two) hours as needed for mouth pain.  Dispense: 360 mL; Refill: 0   Asher Babilonia V. Drenda Freeze, MHS [facility]  04/02/2014

## 2014-04-02 NOTE — Patient Instructions (Signed)
You may gargle mouthwash every 2-3 hours for throat pain. Do not swallow. Continue to take motrin for pain. i will call you with the results of your throat culture. Return if 5-7 days if symptoms worsen or fail to improve.

## 2014-04-06 ENCOUNTER — Telehealth: Payer: Self-pay | Admitting: Radiology

## 2014-04-06 NOTE — Telephone Encounter (Signed)
[facility] called about this pt's throat cx--it was done on 1/21 and we were closed Friday and Saturday and by the time they got it yesterday, it was too late to run the test. I do apologize for this. Unfortunately, this is something we didn't foresee happening with the weather. Please let us know what you would like to do.

## 2014-04-06 NOTE — Telephone Encounter (Signed)
Spoke with pt - he is doing much better. No need for throat culture at this time.

## 2014-09-21 ENCOUNTER — Ambulatory Visit (INDEPENDENT_AMBULATORY_CARE_PROVIDER_SITE_OTHER): Payer: Self-pay | Admitting: Emergency Medicine

## 2014-09-21 VITALS — BP 118/90 | HR 74 | Temp 98.2°F | Resp 15 | Ht 74.0 in | Wt 244.0 lb

## 2014-09-21 DIAGNOSIS — Z021 Encounter for pre-employment examination: Secondary | ICD-10-CM

## 2014-09-21 DIAGNOSIS — Z024 Encounter for examination for driving license: Secondary | ICD-10-CM

## 2014-09-21 NOTE — Progress Notes (Signed)
[facility] [address], [place] [address] [phone]  Date:  09/21/2014   Name:  Gregory Montgomery   DOB:  03-18-1965   MRN:  [id number]  PCP:  Kennon Portela, MD    Chief Complaint: Annual Exam   History of Present Illness:  Gregory Montgomery is a 49 y.o. very pleasant male patient who presents with the following:  DOT physical  There are no active problems to display for this patient.   No past medical history on file.  No Known Allergies  Medication list has been reviewed and updated.  No current outpatient prescriptions on file prior to visit.   No current facility-administered medications on file prior to visit.    Review of Systems:  Review of Systems  Constitutional: Negative for fever, chills and fatigue.  HENT: Negative for congestion, ear pain, hearing loss, postnasal drip, rhinorrhea and sinus pressure.   Eyes: Negative for discharge and redness.  Respiratory: Negative for cough, shortness of breath and wheezing.   Cardiovascular: Negative for chest pain and leg swelling.  Gastrointestinal: Negative for nausea, vomiting, abdominal pain, constipation and blood in stool.  Genitourinary: Negative for dysuria, urgency and frequency.  Musculoskeletal: Negative for neck stiffness.  Skin: Negative for rash.  Neurological: Negative for seizures, weakness and headaches.   Physical Examination: GEN: WDWN, NAD, Non-toxic, A & O x 3 HEENT: Atraumatic, Normocephalic. Neck supple. No masses, No LAD. Ears and Nose: No external deformity. CV: RRR, No M/G/R. No JVD. No thrill. No extra heart sounds. PULM: CTA B, no wheezes, crackles, rhonchi. No retractions. No resp. distress. No accessory muscle use. ABD: S, NT, ND, +BS. No rebound. No HSM. EXTR: No c/c/e NEURO Normal gait.  PSYCH: Normally interactive. Conversant. Not depressed or anxious appearing.  Calm demeanor.   Filed Vitals:   09/21/14 1706  BP: 118/90  Pulse: 74  Temp: 98.2 F  (36.8 C)  Resp: 15   Filed Vitals:   09/21/14 1706  Height: 6\' 2"  (1.88 m)  Weight: 244 lb (110.678 kg)   Body mass index is 31.31 kg/(m^2). Ideal Body Weight: Weight in (lb) to have BMI = 25: 194.3     Assessment and Plan: 1. Encounter for commercial driver medical examination (CDME)      Signed Ellison Carwin, MD

## 2015-01-01 ENCOUNTER — Ambulatory Visit (INDEPENDENT_AMBULATORY_CARE_PROVIDER_SITE_OTHER): Payer: BLUE CROSS/BLUE SHIELD | Admitting: Family Medicine

## 2015-01-01 VITALS — BP 126/86 | HR 89 | Temp 98.7°F | Resp 18 | Ht 73.5 in | Wt 247.2 lb

## 2015-01-01 DIAGNOSIS — M79602 Pain in left arm: Secondary | ICD-10-CM

## 2015-01-01 DIAGNOSIS — M791 Myalgia, unspecified site: Secondary | ICD-10-CM

## 2015-01-01 LAB — COMPREHENSIVE METABOLIC PANEL
ALT: 47 U/L — ABNORMAL HIGH (ref 9–46)
AST: 32 U/L (ref 10–40)
Albumin: 3.6 g/dL (ref 3.6–5.1)
Alkaline Phosphatase: 47 U/L (ref 40–115)
BUN: 12 mg/dL (ref 7–25)
CO2: 25 mmol/L (ref 20–31)
Calcium: 8.7 mg/dL (ref 8.6–10.3)
Chloride: 101 mmol/L (ref 98–110)
Creat: 1.01 mg/dL (ref 0.60–1.35)
Glucose, Bld: 90 mg/dL (ref 65–99)
Potassium: 4.4 mmol/L (ref 3.5–5.3)
Sodium: 138 mmol/L (ref 135–146)
Total Bilirubin: 0.7 mg/dL (ref 0.2–1.2)
Total Protein: 6.9 g/dL (ref 6.1–8.1)

## 2015-01-01 LAB — CK: Total CK: 1205 U/L — ABNORMAL HIGH (ref 7–232)

## 2015-01-01 MED ORDER — MELOXICAM 15 MG PO TABS: 15.0000 mg | ORAL_TABLET | Freq: Every day | ORAL | Status: DC

## 2015-01-01 MED ORDER — CYCLOBENZAPRINE HCL 10 MG PO TABS: 10.0000 mg | ORAL_TABLET | Freq: Two times a day (BID) | ORAL | Status: DC | PRN

## 2015-01-01 NOTE — Progress Notes (Addendum)
[facility] [address] [place]  [address] [phone]  Date:  01/01/2015   Name:  Gregory Montgomery   DOB:  03-Dec-1965   MRN:  [id number]  PCP:  Kennon Portela, MD    Chief Complaint: Elbow Pain   History of Present Illness:  Gregory Montgomery is a 49 y.o. very pleasant male patient who presents with the following:  Generally healthy man here today with complaint of trouble with his left elbow and arm He has been doing some exercises; push-ups and other body weight exercises recently   He has some trouble with his right elbow feeling "really tight" last week.  He backed off on exercise and felt better.  However now his left elbow is hurting him over the last 2-3 days.  He notes that it seems swollen and he cannot fully bend his arm He is not aware of any injury He OW feels well No other unusual muscle pains He had not done any particular exercise for a few days when he first had the left elbow pain  There are no active problems to display for this patient.   History reviewed. No pertinent past medical history.  Past Surgical History  Procedure Laterality Date  . Appendectomy      Social History  Substance Use Topics  . Smoking status: Never Smoker   . Smokeless tobacco: None  . Alcohol Use: No    Family History  Problem Relation Age of Onset  . Stroke Mother   . Allergies Daughter     No Known Allergies  Medication list has been reviewed and updated.  Current Outpatient Prescriptions on File Prior to Visit  Medication Sig Dispense Refill  . Multiple Vitamins-Minerals (MULTIVITAMIN WITH MINERALS) tablet Take 1 tablet by mouth daily.     No current facility-administered medications on file prior to visit.    Review of Systems:  As per HPI- otherwise negative.   Physical Examination: Filed Vitals:   01/01/15 1537  BP: 126/86  Pulse: 89  Temp: 98.7 F (37.1 C)  Resp: 18   Filed Vitals:   01/01/15 1537  Height: 6' 1.5"  (1.867 m)  Weight: 247 lb 3.2 oz (112.129 kg)   Body mass index is 32.17 kg/(m^2). Ideal Body Weight: Weight in (lb) to have BMI = 25: 191.7  GEN: WDWN, NAD, Non-toxic, A & O x 3, looks well HEENT: Atraumatic, Normocephalic. Neck supple. No masses, No LAD. Ears and Nose: No external deformity. CV: RRR, No M/G/R. No JVD. No thrill. No extra heart sounds. PULM: CTA B, no wheezes, crackles, rhonchi. No retractions. No resp. distress. No accessory muscle use. EXTR: No c/c/e NEURO Normal gait.  PSYCH: Normally interactive. Conversant. Not depressed or anxious appearing.  Calm demeanor.  Right arm and elbow wnl Left arm: the arm just superior to the elbow joint is swollen and tender, slightly warm.  Most tender over the distal triceps.  The elbow joint itself if negative- normal extension, pronation and supination.  He has pain with flexion but this seems due to the triceps muscle and not the joint.  Normal BUE strength No numbness, tingling or NV compromise of the forearms   Assessment and Plan: Muscle pain - Plan: Comprehensive metabolic panel, CK, meloxicam (MOBIC) 15 MG tablet, cyclobenzaprine (FLEXERIL) 10 MG tablet  Pain of left arm - Plan: Comprehensive metabolic panel, CK  Here today with left arm pain .  He seems to have acute tenderness and inflammation  of the triceps  Check CMP and CK Start on mobic and flexeril as needed Recheck tomorrow   Signed Lamar Blinks, MD  Called 10/22- he saw Dr. Carlota Raspberry today in clinic. It looks like he was overall getting better. However VM full.  Will have to try back later  Results for orders placed or performed in visit on 01/01/15  Comprehensive metabolic panel  Result Value Ref Range   Sodium 138 135 - 146 mmol/L   Potassium 4.4 3.5 - 5.3 mmol/L   Chloride 101 98 - 110 mmol/L   CO2 25 20 - 31 mmol/L   Glucose, Bld 90 65 - 99 mg/dL   BUN 12 7 - 25 mg/dL   Creat 1.01 0.60 - 1.35 mg/dL   Total Bilirubin 0.7 0.2 - 1.2 mg/dL   Alkaline  Phosphatase 47 40 - 115 U/L   AST 32 10 - 40 U/L   ALT 47 (H) 9 - 46 U/L   Total Protein 6.9 6.1 - 8.1 g/dL   Albumin 3.6 3.6 - 5.1 g/dL   Calcium 8.7 8.6 - 10.3 mg/dL  CK  Result Value Ref Range   Total CK 1205 (H) 7 - 232 U/L

## 2015-01-01 NOTE — Patient Instructions (Addendum)
Please come and see my partner Dr. Carlota Raspberry tomorrow for a recheck For pain and swelling use the meloxicam as needed Use flexeril for pain and to help you sleep as needed- do not drive while on this medication We checked some labs and will be in touch with these asap   If any significant worsening of your pain and swelling overnight go to the ER

## 2015-01-02 ENCOUNTER — Ambulatory Visit (INDEPENDENT_AMBULATORY_CARE_PROVIDER_SITE_OTHER): Payer: BLUE CROSS/BLUE SHIELD | Admitting: Family Medicine

## 2015-01-02 VITALS — BP 114/70 | HR 82 | Temp 98.2°F | Resp 18 | Ht 74.0 in | Wt 243.0 lb

## 2015-01-02 DIAGNOSIS — M791 Myalgia, unspecified site: Secondary | ICD-10-CM

## 2015-01-02 DIAGNOSIS — M25522 Pain in left elbow: Secondary | ICD-10-CM

## 2015-01-02 DIAGNOSIS — M79622 Pain in left upper arm: Secondary | ICD-10-CM

## 2015-01-02 NOTE — Progress Notes (Signed)
Subjective:  This chart was scribed for Gregory Ray, MD by Leandra Kern, Medical Scribe. This patient was seen in Room 14 and the patient's care was started at 10:45 AM.   Patient ID: Gregory Montgomery, male    DOB: 1965/07/13, 49 y.o.   MRN: [id number]  Chief Complaint  Patient presents with  . Follow-up    elbow    HPI HPI Comments: Gregory Montgomery is a 49 y.o. male who presents to [facility] for [facility] follow up.  Pt was seen yesterday by Dr. Lorelei Pont for pain in the left elbow and triceps fort2 -3 days, noticed after increased exercise and push-ups previous week. Also notice tightness in the elbow that previous week. He did have some tightness/ swelling and tenderness in the distal triceps with exam by Dr. Lorelei Pont and myself yesterday, there are no signs of compartment symptom, no distal arm symptom. He was started on Mobic and Flexeril. CMP and CK were sent out yesterday, however results are not available yet.  Today, Pt notes that his symptoms have improved that he is able to bend the elbow and lift his arm. He indicates that the area is still a little tight. Pt reports that he used heating pads on the area yesterday. He is compliant with his prescribed medications. Pt denies numbness in the hand or fingers, or tingling sensation.    There are no active problems to display for this patient.  History reviewed. No pertinent past medical history. Past Surgical History  Procedure Laterality Date  . Appendectomy     No Known Allergies Prior to Admission medications   Medication Sig Start Date End Date Taking? Authorizing Provider  cyclobenzaprine (FLEXERIL) 10 MG tablet Take 1 tablet (10 mg total) by mouth 2 (two) times daily as needed for muscle spasms. 01/01/15  Yes Gay Filler Copland, MD  meloxicam (MOBIC) 15 MG tablet Take 1 tablet (15 mg total) by mouth daily. 01/01/15  Yes Gay Filler Copland, MD  Multiple Vitamins-Minerals (MULTIVITAMIN WITH MINERALS) tablet Take 1  tablet by mouth daily.   Yes Historical Provider, MD   Social History   Social History  . Marital Status: Married    Spouse Name: N/A  . Number of Children: N/A  . Years of Education: N/A   Occupational History  . Not on file.   Social History Main Topics  . Smoking status: Never Smoker   . Smokeless tobacco: Not on file  . Alcohol Use: No  . Drug Use: Not on file  . Sexual Activity: Not on file   Other Topics Concern  . Not on file   Social History Narrative    Review of Systems  Musculoskeletal: Positive for arthralgias. Negative for joint swelling.  Neurological: Negative for weakness and numbness.       Objective:   Physical Exam  Constitutional: He is oriented to person, place, and time. He appears well-developed and well-nourished. No distress.  HENT:  Head: Normocephalic and atraumatic.  Eyes: EOM are normal. Pupils are equal, round, and reactive to light.  Neck: Neck supple.  Cardiovascular: Normal rate.   nvi distally on the hand with warm finger tips, cap refill is less than 1 sec.   Pulmonary/Chest: Effort normal.  Musculoskeletal:  Left Elbow: No tenderness on the bony attachment at the triceps, but some tenderness at the mid- distal triceps. Able to flex the elbow to approximately 90 degrees, full extension. Full pronation, full supination.  Left Wrist: Range  of motion is full with flexion, extension, ulnar and radial rotation. Slight discomfort over the dorsal forearm musculature but no pain with resisted wrist extension. Distal triceps of the left is still firm.   Neurological: He is alert and oriented to person, place, and time. No cranial nerve deficit.  Skin: Skin is warm and dry.  Psychiatric: He has a normal mood and affect. His behavior is normal.  Nursing note and vitals reviewed.  Filed Vitals:   01/02/15 1025  BP: 114/70  Pulse: 82  Temp: 98.2 F (36.8 C)  Resp: 18  Height: 6\' 2"  (1.88 m)  Weight: 243 lb (110.224 kg)  SpO2: 98%       Assessment & Plan:  SEMIR BRILL is a 49 y.o. male Myalgia  Pain in joint, upper arm, left   -Swelling/tricep discomfort after push ups prior week.  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 CPK and CMP pending.   -continue symptomatic care, relative rest, compartment syndrome precautions  -recheck in 3 days - sooner if worse. If not continuing to improve - consider ortho eval.   No orders of the defined types were placed in this encounter.   Patient Instructions  Continue to avoid lifting/tricep work for now. Continue mobic or flexeril if needed. We are still waiting on your blood tests form yesterday.  Follow up in 3 days to determine if orthopaedic eval needed. Return to the clinic or go to the nearest emergency room if any of your symptoms worsen or new symptoms occur.        By signing my name below, I, Rawaa Al Rifaie, attest that this documentation has been prepared under the direction and in the presence of Gregory Ray, MD.  Leandra Kern, Medical Scribe. 01/02/2015.  10:56 AM. I personally performed the services described in this documentation, which was scribed in my presence. The recorded information has been reviewed and considered, and addended by me as needed.

## 2015-01-02 NOTE — Patient Instructions (Signed)
Continue to avoid lifting/tricep work for now. Continue mobic or flexeril if needed. We are still waiting on your blood tests form yesterday.  Follow up in 3 days to determine if orthopaedic eval needed. Return to the clinic or go to the nearest emergency room if any of your symptoms worsen or new symptoms occur.

## 2015-01-04 ENCOUNTER — Encounter: Payer: Self-pay | Admitting: Family Medicine

## 2015-11-03 ENCOUNTER — Encounter: Payer: Self-pay | Admitting: Adult Health

## 2015-11-03 ENCOUNTER — Ambulatory Visit (INDEPENDENT_AMBULATORY_CARE_PROVIDER_SITE_OTHER): Payer: BLUE CROSS/BLUE SHIELD | Admitting: Adult Health

## 2015-11-03 VITALS — BP 130/90 | Temp 98.1°F | Ht 74.0 in | Wt 247.8 lb

## 2015-11-03 DIAGNOSIS — Z7689 Persons encountering health services in other specified circumstances: Secondary | ICD-10-CM

## 2015-11-03 DIAGNOSIS — N481 Balanitis: Secondary | ICD-10-CM

## 2015-11-03 DIAGNOSIS — Z7189 Other specified counseling: Secondary | ICD-10-CM

## 2015-11-03 NOTE — Patient Instructions (Signed)
It was great meeting you today  I will refer you over to urology. You can also try an over the counter steroid cream  Please follow up with me for your physical. If you need anything, please let me know

## 2015-11-03 NOTE — Progress Notes (Signed)
Patient presents to clinic today to establish care. He is a pleasant 50 year old AA male who  has no past medical history on file.   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 He reports that since the age of 25 he has had continued issues with foreskin. He reports pain, irritation and sores around the head of his penis as well as occassions of trouble retracting his foreskin. He places neosporin on the sores and they heal after 2-3 days. Denies any odor. He is worried that he will have to have another circumcision   Health Maintenance: Dental -- Routine visits  Vision --  Routine visits Immunizations -- UTD  Colonoscopy -- Never had   Diet: He is trying to eat healthy  Exercise: He is exercising. Likes to weight lift, walk and run. He is losing weight. Over the past few months he has lost about 13 pounds.    No past medical history on file.  Past Surgical History:  Procedure Laterality Date  . APPENDECTOMY      Current Outpatient Prescriptions on File Prior to Visit  Medication Sig Dispense Refill  . Multiple Vitamins-Minerals (MULTIVITAMIN WITH MINERALS) tablet Take 1 tablet by mouth daily.     No current facility-administered medications on file prior to visit.     No Known Allergies  Family History  Problem Relation Age of Onset  . Stroke Mother   . Allergies Daughter     Social History   Social History  . Marital status: Married    Spouse name: N/A  . Number of children: N/A  . Years of education: N/A   Occupational History  . Not on file.   Social History Main Topics  . Smoking status: Never Smoker  . Smokeless tobacco: Not on file  . Alcohol use No  . Drug use: Unknown  . Sexual activity: Not on file   Other Topics Concern  . Not on file   Social History Narrative  . No narrative on file     Review of Systems  Constitutional: Negative.   HENT: Negative.   Eyes: Negative.   Respiratory: Negative.   Gastrointestinal: Negative.   Genitourinary: Negative.        Penile pain  Musculoskeletal: Negative.   Skin: Negative.   Neurological: Negative.   Psychiatric/Behavioral: Negative.     BP 130/90   Temp 98.1 F (36.7 C) (Oral)   Ht 6\' 2"  (1.88 m)   Wt 247 lb 12.8 oz (112.4 kg)   BMI 31.82 kg/m   Physical Exam  Constitutional: He is oriented to person, place, and time and well-developed, well-nourished, and in no distress. No distress.  Cardiovascular: Normal rate, regular rhythm, normal heart sounds and intact distal pulses.  Exam reveals no gallop and no friction rub.   No murmur heard. Pulmonary/Chest: Effort normal and breath sounds normal. No respiratory distress. He has no wheezes.  Abdominal: Soft. Bowel sounds are normal. He exhibits no distension and no mass. There is no tenderness. There is no rebound and no guarding.  Genitourinary: Penis exhibits no lesions. No discharge found.  Genitourinary Comments: Discoloration to head of penis. No lesions seen. No odor and no discharge at this time He was able to retract foreskin easily.   Musculoskeletal: Normal range of motion. He  exhibits no edema, tenderness or deformity.  Neurological: He is alert and oriented to person, place, and time. Gait normal. GCS score is 15.  Skin: Skin is warm and dry. No rash noted. He is not diaphoretic. No erythema. No pallor.  Psychiatric: Mood, memory, affect and judgment normal.  Nursing note and vitals reviewed.  Assessment/Plan: 1. Encounter to establish care - Follow up for CPE - Encouraged continued weight loss through diet and exercise 2. Balanitis - Continue with current treatment. Can also use OTC hydrocortisone cream  - Ambulatory referral to Urology  Dorothyann Peng, NP

## 2015-11-30 ENCOUNTER — Other Ambulatory Visit (INDEPENDENT_AMBULATORY_CARE_PROVIDER_SITE_OTHER): Payer: BLUE CROSS/BLUE SHIELD

## 2015-11-30 DIAGNOSIS — Z0000 Encounter for general adult medical examination without abnormal findings: Secondary | ICD-10-CM

## 2015-11-30 LAB — BASIC METABOLIC PANEL
BUN: 16 mg/dL (ref 6–23)
CO2: 27 mEq/L (ref 19–32)
Calcium: 8.8 mg/dL (ref 8.4–10.5)
Chloride: 102 mEq/L (ref 96–112)
Creatinine, Ser: 1.2 mg/dL (ref 0.40–1.50)
GFR: 83 mL/min (ref >60.00)
Glucose, Bld: 78 mg/dL (ref 70–99)
Potassium: 4.2 mEq/L (ref 3.5–5.1)
Sodium: 136 mEq/L (ref 135–145)

## 2015-11-30 LAB — CBC WITH DIFFERENTIAL/PLATELET
Basophils Absolute: 0 10*3/uL (ref 0.0–0.1)
Basophils Relative: 0.4 % (ref 0.0–3.0)
Eosinophils Absolute: 0.1 10*3/uL (ref 0.0–0.7)
Eosinophils Relative: 2.8 % (ref 0.0–5.0)
HCT: 47 % (ref 39.0–52.0)
Hemoglobin: 15.6 g/dL (ref 13.0–17.0)
Lymphocytes Relative: 45.8 % (ref 12.0–46.0)
Lymphs Abs: 2.3 10*3/uL (ref 0.7–4.0)
MCHC: 33.3 g/dL (ref 30.0–36.0)
MCV: 84 fl (ref 78.0–100.0)
Monocytes Absolute: 0.5 10*3/uL (ref 0.1–1.0)
Monocytes Relative: 9.3 % (ref 3.0–12.0)
Neutro Abs: 2.1 10*3/uL (ref 1.4–7.7)
Neutrophils Relative %: 41.7 % — ABNORMAL LOW (ref 43.0–77.0)
Platelets: 175 10*3/uL (ref 150.0–400.0)
RBC: 5.59 Mil/uL (ref 4.22–5.81)
RDW: 14.1 % (ref 11.5–15.5)
WBC: 5 10*3/uL (ref 4.0–10.5)

## 2015-11-30 LAB — LIPID PANEL
Cholesterol: 231 mg/dL — ABNORMAL HIGH (ref 0–200)
HDL: 45.4 mg/dL (ref >39.00)
LDL Cholesterol: 168 mg/dL — ABNORMAL HIGH (ref 0–99)
NonHDL: 185.16
Total CHOL/HDL Ratio: 5
Triglycerides: 84 mg/dL (ref 0.0–149.0)
VLDL: 16.8 mg/dL (ref 0.0–40.0)

## 2015-11-30 LAB — HEPATIC FUNCTION PANEL
ALT: 15 U/L (ref 0–53)
AST: 12 U/L (ref 0–37)
Albumin: 4 g/dL (ref 3.5–5.2)
Alkaline Phosphatase: 43 U/L (ref 39–117)
Bilirubin, Direct: 0.2 mg/dL (ref 0.0–0.3)
Total Bilirubin: 0.9 mg/dL (ref 0.2–1.2)
Total Protein: 6.9 g/dL (ref 6.0–8.3)

## 2015-11-30 LAB — TSH: TSH: 4.07 u[IU]/mL (ref 0.35–4.50)

## 2015-11-30 LAB — PSA: PSA: 0.61 ng/mL (ref 0.10–4.00)

## 2015-12-01 LAB — POC URINALSYSI DIPSTICK (AUTOMATED)
Blood, UA: NEGATIVE
Glucose, UA: NEGATIVE
Ketones, UA: NEGATIVE
Leukocytes, UA: NEGATIVE
Nitrite, UA: NEGATIVE
Spec Grav, UA: 1.025
Urobilinogen, UA: 0.2
pH, UA: 5.5

## 2015-12-07 ENCOUNTER — Encounter: Payer: Self-pay | Admitting: Adult Health

## 2015-12-07 ENCOUNTER — Ambulatory Visit (INDEPENDENT_AMBULATORY_CARE_PROVIDER_SITE_OTHER): Payer: BLUE CROSS/BLUE SHIELD | Admitting: Adult Health

## 2015-12-07 VITALS — BP 124/76 | Temp 98.2°F | Ht 74.0 in | Wt 247.0 lb

## 2015-12-07 DIAGNOSIS — E785 Hyperlipidemia, unspecified: Secondary | ICD-10-CM

## 2015-12-07 DIAGNOSIS — Z0000 Encounter for general adult medical examination without abnormal findings: Secondary | ICD-10-CM | POA: Diagnosis not present

## 2015-12-07 NOTE — Progress Notes (Signed)
Subjective:    Patient ID: Gregory Montgomery, male    DOB: [birth year], 50 y.o.   MRN: [id number]  HPI  Patient presents for yearly preventative medicine examination. He is a pleasant 50 year old male who  has a past medical history of Chicken pox.  All immunizations and health maintenance protocols were reviewed with the patient and needed orders were placed.  Medication reconciliation,  past medical history, social history, problem list and allergies were reviewed in detail with the patient.   Goals were established with regard to weight loss, exercise, and  diet in compliance with medications. He is doing a lot walking but is not doing anything besides that. He is trying to eat healthy . Has cut back on carbs.   He is up to date on vision and dental screens  He has no acute concerns today   Review of Systems  Constitutional: Negative.   HENT: Negative.   Eyes: Negative.   Respiratory: Negative.   Cardiovascular: Negative.   Gastrointestinal: Negative.   Endocrine: Negative.   Genitourinary: Negative.   Musculoskeletal: Negative.   Skin: Negative.   Allergic/Immunologic: Negative.   Neurological: Negative.   Hematological: Negative.   Psychiatric/Behavioral: Negative.   All other systems reviewed and are negative.  Past Medical History:  Diagnosis Date  . Chicken pox     Social History   Social History  . Marital status: Married    Spouse name: N/A  . Number of children: N/A  . Years of education: N/A   Occupational History  . Not on file.   Social History Main Topics  . Smoking status: Never Smoker  . Smokeless tobacco: Not on file  . Alcohol use No  . Drug use: No  . Sexual activity: Not on file   Other Topics Concern  . Not on file   Social History Narrative   Married for 75 years    One daughters ( 34 years old) Lives local          Likes to ride his motorcycle and weight training.     Past Surgical History:  Procedure Laterality Date  .  APPENDECTOMY    . CIRCUMCISION     newborn and at age 81    Family History  Problem Relation Age of Onset  . Stroke Mother   . Hypertension Mother   . Allergies Daughter     No Known Allergies  Current Outpatient Prescriptions on File Prior to Visit  Medication Sig Dispense Refill  . Multiple Vitamins-Minerals (MULTIVITAMIN WITH MINERALS) tablet Take 1 tablet by mouth daily.     No current facility-administered medications on file prior to visit.     BP 124/76   Temp 98.2 F (36.8 C) (Oral)   Ht 6\' 2"  (1.88 m)   Wt 247 lb (112 kg)   BMI 31.71 kg/m       Objective:   Physical Exam  Constitutional: He is oriented to person, place, and time. He appears well-developed and well-nourished. No distress.  HENT:  Head: Normocephalic and atraumatic.  Right Ear: Hearing, external ear and ear canal normal.  Left Ear: Hearing, external ear and ear canal normal.  Nose: Nose normal.  Mouth/Throat: Oropharynx is clear and moist. No oropharyngeal exudate.  Eyes: Conjunctivae and EOM are normal. Pupils are equal, round, and reactive to light. Right eye exhibits no discharge. Left eye exhibits no discharge. No scleral icterus.  Neck: Normal range of motion and full passive range of motion  without pain. Neck supple. No JVD present. Carotid bruit is not present. No tracheal deviation present. No thyroid mass and no thyromegaly present.  Cardiovascular: Normal rate, regular rhythm, normal heart sounds and intact distal pulses.  Exam reveals no gallop and no friction rub.   No murmur heard. Pulmonary/Chest: Effort normal and breath sounds normal. No stridor. No respiratory distress. He has no wheezes. He has no rales. He exhibits no tenderness.  Abdominal: Soft. Bowel sounds are normal. He exhibits no distension and no mass. There is no tenderness. There is no rebound and no guarding.  Genitourinary: Rectum normal and prostate normal. Rectal exam shows no external hemorrhoid, no internal  hemorrhoid, anal tone normal and guaiac negative stool. Prostate is not enlarged and not tender. No penile tenderness.  Musculoskeletal: Normal range of motion. He exhibits no edema, tenderness or deformity.  Lymphadenopathy:    He has no cervical adenopathy.  Neurological: He is alert and oriented to person, place, and time. He has normal reflexes. He displays normal reflexes. No cranial nerve deficit. He exhibits normal muscle tone. Coordination normal.  Skin: Skin is warm and dry. No rash noted. He is not diaphoretic. No erythema. No pallor.  Psychiatric: He has a normal mood and affect. His behavior is normal. Judgment and thought content normal.  Nursing note and vitals reviewed.     Assessment & Plan:  1. Routine general medical examination at a health care facility - Follow up in one year for next CPE - Increase exercise intensity and duration  - Continue to eat healthy   2. Hyperlipidemia  Lab Results  Component Value Date   CHOL 231 (H) 11/30/2015   HDL 45.40 11/30/2015   LDLCALC 168 (H) 11/30/2015   TRIG 84.0 11/30/2015   CHOLHDL 5 11/30/2015   - Does not need to go on statin at this time - Changes in diet and exercise should help resolve this.  - Will continue to monitor   Dorothyann Peng, NP

## 2015-12-07 NOTE — Patient Instructions (Signed)
It was great seeing you today!  The biggest thing with you will be losing that little bit of weight. Increase exercise and get your heart rate up for at least 30 minutes a day.   Continue to eat healthy   Follow up with me in one year  Health Maintenance, Male A healthy lifestyle and preventative care can promote health and wellness.  Maintain regular health, dental, and eye exams.  Eat a healthy diet. Foods like vegetables, fruits, whole grains, low-fat dairy products, and lean protein foods contain the nutrients you need and are low in calories. Decrease your intake of foods high in solid fats, added sugars, and salt. Get information about a proper diet from your health care provider, if necessary.  Regular physical exercise is one of the most important things you can do for your health. Most adults should get at least 150 minutes of moderate-intensity exercise (any activity that increases your heart rate and causes you to sweat) each week. In addition, most adults need muscle-strengthening exercises on 2 or more days a week.   Maintain a healthy weight. The body mass index (BMI) is a screening tool to identify possible weight problems. It provides an estimate of body fat based on height and weight. Your health care provider can find your BMI and can help you achieve or maintain a healthy weight. For males 20 years and older:  A BMI below 18.5 is considered underweight.  A BMI of 18.5 to 24.9 is normal.  A BMI of 25 to 29.9 is considered overweight.  A BMI of 30 and above is considered obese.  Maintain normal blood lipids and cholesterol by exercising and minimizing your intake of saturated fat. Eat a balanced diet with plenty of fruits and vegetables. Blood tests for lipids and cholesterol should begin at age 42 and be repeated every 5 years. If your lipid or cholesterol levels are high, you are over age 64, or you are at high risk for heart disease, you may need your cholesterol  levels checked more frequently.Ongoing high lipid and cholesterol levels should be treated with medicines if diet and exercise are not working.  If you smoke, find out from your health care provider how to quit. If you do not use tobacco, do not start.  Lung cancer screening is recommended for adults aged 14-80 years who are at high risk for developing lung cancer because of a history of smoking. A yearly low-dose CT scan of the lungs is recommended for people who have at least a 30-pack-year history of smoking and are current smokers or have quit within the past 15 years. A pack year of smoking is smoking an average of 1 pack of cigarettes a day for 1 year (for example, a 30-pack-year history of smoking could mean smoking 1 pack a day for 30 years or 2 packs a day for 15 years). Yearly screening should continue until the smoker has stopped smoking for at least 15 years. Yearly screening should be stopped for people who develop a health problem that would prevent them from having lung cancer treatment.  If you choose to drink alcohol, do not have more than 2 drinks per day. One drink is considered to be 12 oz (360 mL) of beer, 5 oz (150 mL) of wine, or 1.5 oz (45 mL) of liquor.  Avoid the use of street drugs. Do not share needles with anyone. Ask for help if you need support or instructions about stopping the use of drugs.  High blood pressure causes heart disease and increases the risk of stroke. High blood pressure is more likely to develop in:  People who have blood pressure in the end of the normal range (100-139/85-89 mm Hg).  People who are overweight or obese.  People who are African American.  If you are 58-22 years of age, have your blood pressure checked every 3-5 years. If you are 75 years of age or older, have your blood pressure checked every year. You should have your blood pressure measured twice--once when you are at a hospital or clinic, and once when you are not at a hospital or  clinic. Record the average of the two measurements. To check your blood pressure when you are not at a hospital or clinic, you can use:  An automated blood pressure machine at a pharmacy.  A home blood pressure monitor.  If you are 53-45 years old, ask your health care provider if you should take aspirin to prevent heart disease.  Diabetes screening involves taking a blood sample to check your fasting blood sugar level. This should be done once every 3 years after age 56 if you are at a normal weight and without risk factors for diabetes. Testing should be considered at a younger age or be carried out more frequently if you are overweight and have at least 1 risk factor for diabetes.  Colorectal cancer can be detected and often prevented. Most routine colorectal cancer screening begins at the age of 42 and continues through age 64. However, your health care provider may recommend screening at an earlier age if you have risk factors for colon cancer. On a yearly basis, your health care provider may provide home test kits to check for hidden blood in the stool. A small camera at the end of a tube may be used to directly examine the colon (sigmoidoscopy or colonoscopy) to detect the earliest forms of colorectal cancer. Talk to your health care provider about this at age 54 when routine screening begins. A direct exam of the colon should be repeated every 5-10 years through age 58, unless early forms of precancerous polyps or small growths are found.  People who are at an increased risk for hepatitis B should be screened for this virus. You are considered at high risk for hepatitis B if:  You were born in a country where hepatitis B occurs often. Talk with your health care provider about which countries are considered high risk.  Your parents were born in a high-risk country and you have not received a shot to protect against hepatitis B (hepatitis B vaccine).  You have HIV or AIDS.  You use needles  to inject street drugs.  You live with, or have sex with, someone who has hepatitis B.  You are a man who has sex with other men (MSM).  You get hemodialysis treatment.  You take certain medicines for conditions like cancer, organ transplantation, and autoimmune conditions.  Hepatitis C blood testing is recommended for all people born from [birth year] through 1963 and any individual with known risk factors for hepatitis C.  Healthy men should no longer receive prostate-specific antigen (PSA) blood tests as part of routine cancer screening. Talk to your health care provider about prostate cancer screening.  Testicular cancer screening is not recommended for adolescents or adult males who have no symptoms. Screening includes self-exam, a health care provider exam, and other screening tests. Consult with your health care provider about any symptoms you have or any  concerns you have about testicular cancer.  Practice safe sex. Use condoms and avoid high-risk sexual practices to reduce the spread of sexually transmitted infections (STIs).  You should be screened for STIs, including gonorrhea and chlamydia if:  You are sexually active and are younger than 24 years.  You are older than 24 years, and your health care provider tells you that you are at risk for this type of infection.  Your sexual activity has changed since you were last screened, and you are at an increased risk for chlamydia or gonorrhea. Ask your health care provider if you are at risk.  If you are at risk of being infected with HIV, it is recommended that you take a prescription medicine daily to prevent HIV infection. This is called pre-exposure prophylaxis (PrEP). You are considered at risk if:  You are a man who has sex with other men (MSM).  You are a heterosexual man who is sexually active with multiple partners.  You take drugs by injection.  You are sexually active with a partner who has HIV.  Talk with your health  care provider about whether you are at high risk of being infected with HIV. If you choose to begin PrEP, you should first be tested for HIV. You should then be tested every 3 months for as long as you are taking PrEP.  Use sunscreen. Apply sunscreen liberally and repeatedly throughout the day. You should seek shade when your shadow is shorter than you. Protect yourself by wearing long sleeves, pants, a wide-brimmed hat, and sunglasses year round whenever you are outdoors.  Tell your health care provider of new moles or changes in moles, especially if there is a change in shape or color. Also, tell your health care provider if a mole is larger than the size of a pencil eraser.  A one-time screening for abdominal aortic aneurysm (AAA) and surgical repair of large AAAs by ultrasound is recommended for men aged 52-75 years who are current or former smokers.  Stay current with your vaccines (immunizations).   This information is not intended to replace advice given to you by your health care provider. Make sure you discuss any questions you have with your health care provider.   Document Released: 08/26/2007 Document Revised: 03/20/2014 Document Reviewed: 07/25/2010 Elsevier Interactive Patient Education [employer].

## 2016-09-19 ENCOUNTER — Encounter: Payer: Self-pay | Admitting: Family Medicine

## 2016-09-19 ENCOUNTER — Ambulatory Visit (INDEPENDENT_AMBULATORY_CARE_PROVIDER_SITE_OTHER): Payer: Self-pay | Admitting: Family Medicine

## 2016-09-19 VITALS — BP 125/83 | HR 76 | Temp 97.9°F | Resp 16 | Ht 74.5 in | Wt 241.4 lb

## 2016-09-19 DIAGNOSIS — Z024 Encounter for examination for driving license: Secondary | ICD-10-CM

## 2016-09-19 NOTE — Progress Notes (Signed)
By signing my name below, I, Gregory Montgomery, attest that this documentation has been prepared under the direction and in the presence of Gregory Ray, MD.  Electronically Signed: Verlee Montgomery, Medical Scribe. 09/19/16. 5:27 PM.  Subjective:    Patient ID: Gregory Montgomery, male    DOB: 1965/08/30, 51 y.o.   MRN: [id number]  HPI Chief Complaint  Patient presents with  . Employment Physical    DOT    HPI Comments: Gregory Montgomery is a 51 y.o. male who presents to Primary Care at [facility] for his DOT physical. Chart reviewed with annual exam at [facility] Sept 2017. HLD without need for medication initially.   Pt's last DOT card was a 2 year card. 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 Denies DOE, chest pain, chest tightness, extremity weakness, myalgias, arthralgias, leg swelling, or other acute sxs.  Vision/Hearing: Denies glaucoma, conditions with eyes, night time blindness, blurry vision, or other acute sxs.  Visual Acuity Screening   Right eye Left eye Both eyes  Without correction:     With correction: 20/13 20/15 20/10   Comments: 85 degrees peripheral vision   Hearing Screening Comments: 10 feet whisper   Snoring: He had an evaluation in 2014 with home sleep test. No CPAP needed Pt had sleep study done about 5 years ago for excessive snoring. He was told it was due to the adenoid glands, and from a old bed - no signs of sleep apnea. He recently got a new sleep number bed and sleeps much better. Denies daytime somnolence, waking up sleepy, stop breathing while sleeping, or choking while sleeping.  There are no active problems to display for this patient.  Past Medical History:  Diagnosis Date  . Chicken pox    Past Surgical History:  Procedure Laterality Date  . APPENDECTOMY    . CIRCUMCISION     newborn and at age 18   No Known Allergies Prior to Admission medications   Medication Sig Start Date End Date Taking? Authorizing Provider  Multiple  Vitamins-Minerals (MULTIVITAMIN WITH MINERALS) tablet Take 1 tablet by mouth daily.   Yes [provider]   Social History   Social History  . Marital status: Married    Spouse name: N/A  . Number of children: N/A  . Years of education: N/A   Occupational History  . Not on file.   Social History Main Topics  . Smoking status: Never Smoker  . Smokeless tobacco: Never Used  . Alcohol use No  . Drug use: No  . Sexual activity: Not on file   Other Topics Concern  . Not on file   Social History Narrative   Married for 84 years    One daughters ( 79 years old) Lives local          Likes to ride his motorcycle and weight training.    Review of Systems  Constitutional: Negative for fatigue.  Eyes: Negative for photophobia and visual disturbance.  Respiratory: Negative for choking, chest tightness, shortness of breath and wheezing.   Cardiovascular: Negative for chest pain and leg swelling.  Musculoskeletal: Negative for gait problem.  Neurological: Negative for weakness and numbness.  Psychiatric/Behavioral: Negative for sleep disturbance.   Objective:  Physical Exam  Constitutional: He is oriented to person, place, and time. He appears well-developed and well-nourished.  HENT:  Head: Normocephalic and atraumatic.  Right Ear: External ear normal.  Left Ear: External ear normal.  Mouth/Throat: Oropharynx is clear and  moist.  Eyes: Conjunctivae and EOM are normal. Pupils are equal, round, and reactive to light.  Neck: Normal range of motion. Neck supple. No thyromegaly present.  Cardiovascular: Normal rate, regular rhythm, normal heart sounds and intact distal pulses.   Pulmonary/Chest: Effort normal and breath sounds normal. No respiratory distress. He has no wheezes.  Abdominal: Soft. He exhibits no distension. There is no tenderness. Hernia confirmed negative in the right inguinal area and confirmed negative in the left inguinal area.  Musculoskeletal: Normal  range of motion. He exhibits no edema or tenderness.  Lymphadenopathy:    He has no cervical adenopathy.  Neurological: He is alert and oriented to person, place, and time. He has normal reflexes.  Skin: Skin is warm and dry.  Psychiatric: He has a normal mood and affect. His behavior is normal.  Vitals reviewed.   Vitals:   09/19/16 1718  BP: 125/83  Pulse: 76  Resp: 16  Temp: 97.9 F (36.6 C)  TempSrc: Oral  SpO2: 95%  Weight: 241 lb 6.4 oz (109.5 kg)  Height: 6' 2.5" (1.892 m)   Body mass index is 30.58 kg/m.    Visual Acuity Screening   Right eye Left eye Both eyes  Without correction:     With correction: 20/13 20/15 20/10   Comments: 85 degrees peripheral vision   Hearing Screening Comments: 10 feet whisper   Assessment & Plan:  [name] is a 51 y.o. 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 Reviewed prior sleep study note without recommendations for CPAP. Corrective lenses to drive. See DOT paperwork  No orders of the defined types were placed in this encounter.  Patient Instructions   No concerns on exam today. 2 year card provided. Previous sleep study in 2014 did not indicate need for CPAP.  Let me know if you have any questions.   IF you received an x-Montgomery today, you will receive an invoice from [facility]. Please contact [facility] at [phone] with questions or concerns regarding your invoice.   IF you received labwork today, you will receive an invoice from [facility]. Please contact [facility] at [phone] with questions or concerns regarding your invoice.   Our billing staff will not be able to assist you with questions regarding bills from these companies.  You will be contacted with the lab results as soon as they are available. The fastest way to get your results is to activate your My Chart account. Instructions are located on the last page of this paperwork. If you have not heard  from [place] regarding the results in 2 weeks, please contact this office.       I personally performed the services described in this documentation, which was scribed in my presence. The recorded information has been reviewed and considered for accuracy and completeness, addended by me as needed, and agree with information above.  Signed,   Gregory Ray, MD Primary Care at [facility].  09/19/16 6:35 PM

## 2016-09-19 NOTE — Patient Instructions (Addendum)
No concerns on exam today. 2 year card provided. Previous sleep study in 2014 did not indicate need for CPAP.  Let me know if you have any questions.   IF you received an x-ray today, you will receive an invoice from [facility]. Please contact [facility] at [phone] with questions or concerns regarding your invoice.   IF you received labwork today, you will receive an invoice from [facility]. Please contact [facility] at [phone] with questions or concerns regarding your invoice.   Our billing staff will not be able to assist you with questions regarding bills from these companies.  You will be contacted with the lab results as soon as they are available. The fastest way to get your results is to activate your My Chart account. Instructions are located on the last page of this paperwork. If you have not heard from [place] regarding the results in 2 weeks, please contact this office.

## 2017-09-10 DIAGNOSIS — I82409 Acute embolism and thrombosis of unspecified deep veins of unspecified lower extremity: Secondary | ICD-10-CM

## 2017-09-10 HISTORY — DX: Acute embolism and thrombosis of unspecified deep veins of unspecified lower extremity: I82.409

## 2017-09-15 ENCOUNTER — Other Ambulatory Visit (HOSPITAL_COMMUNITY): Payer: Self-pay | Admitting: Orthopedic Surgery

## 2017-09-15 ENCOUNTER — Other Ambulatory Visit: Payer: Self-pay | Admitting: Orthopedic Surgery

## 2017-09-15 ENCOUNTER — Ambulatory Visit (HOSPITAL_BASED_OUTPATIENT_CLINIC_OR_DEPARTMENT_OTHER)
Admission: RE | Admit: 2017-09-15 | Discharge: 2017-09-15 | Disposition: A | Discharge status: Home / Self Care | Payer: BLUE CROSS/BLUE SHIELD | Source: Ambulatory Visit | Attending: Orthopedic Surgery | Admitting: Orthopedic Surgery

## 2017-09-15 ENCOUNTER — Encounter (HOSPITAL_COMMUNITY): Payer: Self-pay

## 2017-09-15 ENCOUNTER — Emergency Department (HOSPITAL_COMMUNITY)
Admission: EM | Admit: 2017-09-15 | Discharge: 2017-09-15 | Disposition: A | Discharge status: Home / Self Care | Payer: BLUE CROSS/BLUE SHIELD | Attending: Emergency Medicine | Admitting: Emergency Medicine

## 2017-09-15 DIAGNOSIS — M7989 Other specified soft tissue disorders: Secondary | ICD-10-CM

## 2017-09-15 DIAGNOSIS — I82492 Acute embolism and thrombosis of other specified deep vein of left lower extremity: Secondary | ICD-10-CM | POA: Insufficient documentation

## 2017-09-15 DIAGNOSIS — Z79899 Other long term (current) drug therapy: Secondary | ICD-10-CM | POA: Insufficient documentation

## 2017-09-15 DIAGNOSIS — R52 Pain, unspecified: Secondary | ICD-10-CM

## 2017-09-15 DIAGNOSIS — I82442 Acute embolism and thrombosis of left tibial vein: Secondary | ICD-10-CM

## 2017-09-15 DIAGNOSIS — R2242 Localized swelling, mass and lump, left lower limb: Secondary | ICD-10-CM | POA: Diagnosis present

## 2017-09-15 LAB — I-STAT CHEM 8, ED
BUN: 14 mg/dL (ref 6–20)
Calcium, Ion: 1.17 mmol/L (ref 1.15–1.40)
Chloride: 102 mmol/L (ref 98–111)
Creatinine, Ser: 1.2 mg/dL (ref 0.61–1.24)
Glucose, Bld: 86 mg/dL (ref 70–99)
HCT: 48 % (ref 39.0–52.0)
Hemoglobin: 16.3 g/dL (ref 13.0–17.0)
Potassium: 4.5 mmol/L (ref 3.5–5.1)
Sodium: 140 mmol/L (ref 135–145)
TCO2: 26 mmol/L (ref 22–32)

## 2017-09-15 MED ORDER — RIVAROXABAN (XARELTO) VTE STARTER PACK (15 & 20 MG): 15.0000 mg | ORAL_TABLET | Freq: Two times a day (BID) | ORAL | 0 refills | Status: DC

## 2017-09-15 MED ORDER — RIVAROXABAN (XARELTO) EDUCATION KIT FOR DVT/PE PATIENTS
PACK | Freq: Once | Status: AC
  Administered 2017-09-15: 15:00:00
  Filled 2017-09-15: qty 1

## 2017-09-15 MED ORDER — RIVAROXABAN 15 MG PO TABS
15.0000 mg | ORAL_TABLET | Freq: Once | ORAL | Status: AC
  Administered 2017-09-15: 15 mg via ORAL
  Filled 2017-09-15: qty 1

## 2017-09-15 NOTE — ED Provider Notes (Signed)
[facility] EMERGENCY DEPARTMENT Provider Note   CSN: [id number] Arrival date & time: 09/15/17  1258     History   Chief Complaint Chief Complaint  Patient presents with  . DVT    HPI Gregory Montgomery is a 52 y.o. male.  HPI   Gregory Montgomery is a 53yo male with no significant past medical history who presents to the emergency department after having an ultrasound which was positive for DVT.  Patient reports that he has noticed increasing left lower leg pain and swelling for the past 3 weeks now.  He reports pain is moderate, constant and throbbing in nature.  No triggers to the pain.  He has been taking Motrin for pain at home, with some improvement.  He was seen at his orthopedic office today where he was sent for DVT study.  Per vascular ultrasound note, there is an acute DVT and left posterior tibial vein.  Patient denies fevers, chills, night sweats, unexpected weight changes, numbness, weakness, rash or wound, chest pain, shortness of breath, abdominal pain.  Denies history of DVT/PE, recent surgery or immobilization, active cancer, exogenous hormones.  He does have a PCP.  Past Medical History:  Diagnosis Date  . Chicken pox     There are no active problems to display for this patient.   Past Surgical History:  Procedure Laterality Date  . APPENDECTOMY    . CIRCUMCISION     newborn and at age 41        Home Medications    Prior to Admission medications   Medication Sig Start Date End Date Taking? Authorizing Provider  Multiple Vitamins-Minerals (MULTIVITAMIN WITH MINERALS) tablet Take 1 tablet by mouth daily.    [provider]    Family History Family History  Problem Relation Age of Onset  . Stroke Mother   . Hypertension Mother   . Allergies Daughter     Social History Social History   Tobacco Use  . Smoking status: Never Smoker  . Smokeless tobacco: Never Used  Substance Use Topics  . Alcohol use: No  . Drug use: No      Allergies   Patient has no known allergies.   Review of Systems Review of Systems  Constitutional: Negative for chills and fever.  Respiratory: Negative for shortness of breath.   Cardiovascular: Positive for leg swelling (left lower leg). Negative for chest pain.  Gastrointestinal: Negative for abdominal pain.  Genitourinary: Negative for difficulty urinating.  Musculoskeletal: Positive for myalgias (left lower leg). Negative for gait problem.  Skin: Negative for color change.  Neurological: Negative for weakness and numbness.  All other systems reviewed and are negative.    Physical Exam Updated Vital Signs BP 121/89   Pulse 60   Temp 98 F (36.7 C) (Oral)   Resp 18   SpO2 96%   Physical Exam  Constitutional: He appears well-developed and well-nourished. No distress.  HENT:  Head: Normocephalic and atraumatic.  Eyes: Right eye exhibits no discharge. Left eye exhibits no discharge.  Cardiovascular: Normal rate, regular rhythm and intact distal pulses.  No murmur heard. Pulmonary/Chest: Effort normal and breath sounds normal. No stridor. No respiratory distress. He has no wheezes. He has no rales.  Abdominal: Soft. There is no tenderness.  Musculoskeletal:  Left lower leg swollen below the knee. No erythema, warmth or break in skin. Tender to palpation diffusely over left calf. Full ROM of left knee and ankle without pain. DP pulses 2+ and symmetric  bilaterally. Sensation to light touch intact in bilateral LE.   Neurological: He is alert. Coordination normal.  Skin: Skin is warm and dry. Capillary refill takes less than 2 seconds. He is not diaphoretic.  Psychiatric: He has a normal mood and affect. His behavior is normal.  Nursing note and vitals reviewed.    ED Treatments / Results  Labs (all labs ordered are listed, but only abnormal results are displayed) Labs Reviewed  I-STAT CHEM 8, ED    EKG None  Radiology No results  found.  Procedures Procedures (including critical care time)  Medications Ordered in ED Medications  Rivaroxaban (XARELTO) tablet 15 mg (has no administration in time range)  rivaroxaban (XARELTO) Education Kit for DVT/PE patients (has no administration in time range)     Initial Impression / Assessment and Plan / ED Course  I have reviewed the triage vital signs and the nursing notes.  Pertinent labs & imaging results that were available during my care of the patient were reviewed by me and considered in my medical decision making (see chart for details).     Patient with left acute posterior tibial DVT.  LLE neurovascularly intact. Normal creatinine.  Will start on Xarelto.  Have counseled patient on the bleeding risks of Xarelto.  Patient received his first dose of Xarelto in the ER today.  Patient's vital signs stable, no tachycardia or hypoxia. No chest pain or shortness of breath.  No concern for PE.  I have counseled patient to follow-up with his PCP given today's diagnosis for further work-up of potential cause.  Patient declines pain medication, states that he will take Motrin at home.  Discussed reasons to return to the emergency department and patient agrees and appears reliable.  Final Clinical Impressions(s) / ED Diagnoses   Final diagnoses:  Acute deep vein thrombosis (DVT) of tibial vein of left lower extremity [facility])    ED Discharge Orders        Ordered    Rivaroxaban 15 & 20 MG TBPK  2 times daily     09/15/17 1412       Glyn Ade, PA-C 09/15/17 1414    Valarie Merino, MD 09/16/17 [phone]

## 2017-09-15 NOTE — Discharge Instructions (Addendum)
You have a clot in your left leg.  Please take Xarelto as prescribed.  This medicine puts you at an increased risk of bleed.  If you have uncontrollable bleeding return to the emergency department immediately.  Also return if you hit your head in any way.  If you have shortness of breath or chest pain, come to the emergency department.  Follow-up with your regular doctor regarding today's diagnosis and to determine when you can stop taking the Xarelto.  Information on my medicine - XARELTO (rivaroxaban)  This medication education was reviewed with me or my healthcare representative as part of my discharge preparation.  The pharmacist that spoke with me during my hospital stay was:  Carnella [place], Pope? Xarelto was prescribed to treat blood clots that may have been found in the veins of your legs (deep vein thrombosis) or in your lungs (pulmonary embolism) and to reduce the risk of them occurring again.  What do you need to know about Xarelto? The starting dose is one 15 mg tablet taken TWICE daily with food for the FIRST 21 DAYS then on 6/27  the dose is changed to one 20 mg tablet taken ONCE A DAY with your evening meal.  DO NOT stop taking Xarelto without talking to the health care provider who prescribed the medication.  Refill your prescription for 20 mg tablets before you run out.  After discharge, you should have regular check-up appointments with your healthcare provider that is prescribing your Xarelto.  In the future your dose may need to be changed if your kidney function changes by a significant amount.  What do you do if you miss a dose? If you are taking Xarelto TWICE DAILY and you miss a dose, take it as soon as you remember. You may take two 15 mg tablets (total 30 mg) at the same time then resume your regularly scheduled 15 mg twice daily the next day.  If you are taking Xarelto ONCE DAILY and you miss a dose, take it as soon as you  remember on the same day then continue your regularly scheduled once daily regimen the next day. Do not take two doses of Xarelto at the same time.   Important Safety Information Xarelto is a blood thinner medicine that can cause bleeding. You should call your healthcare provider right away if you experience any of the following: ? Bleeding from an injury or your nose that does not stop. ? Unusual colored urine (red or dark brown) or unusual colored stools (red or black). ? Unusual bruising for unknown reasons. ? A serious fall or if you hit your head (even if there is no bleeding).  Some medicines may interact with Xarelto and might increase your risk of bleeding while on Xarelto. To help avoid this, consult your healthcare provider or pharmacist prior to using any new prescription or non-prescription medications, including herbals, vitamins, non-steroidal anti-inflammatory drugs (NSAIDs) and supplements.  This website has more information on Xarelto: [email or web address]-benson.[email or web address]

## 2017-09-15 NOTE — Progress Notes (Addendum)
VASCULAR LAB PRELIMINARY  PRELIMINARY  PRELIMINARY  PRELIMINARY  Left lower extremity venous duplex completed.    Preliminary report:  There is acute DVT noted in the left posterior tibial vein.    Called results to Joanell Rising, PA-C, he directed me to take Mr. Windsor to the ED for treatment  Sharion Dove, RVT 09/15/2017, 12:47 PM

## 2017-09-15 NOTE — ED Triage Notes (Signed)
Pt sent to ED from [facility] acute DVT noted in the left posterior tibial vein.  Needs prescription.

## 2017-09-15 NOTE — ED Notes (Signed)
PA at bedside updating pt 

## 2017-09-15 NOTE — ED Notes (Signed)
Patient able to ambulate independently  

## 2017-09-15 NOTE — Progress Notes (Signed)
VDUS of left lower extremity

## 2017-09-15 NOTE — ED Notes (Signed)
Pharmacy at bedside discussing medication.  Pt encouraged to eat.  States he does not want to eat anything from the hospital.  Pharmacist and this RN explained need for food for medication absorption.  Pt agreed to plain saltines, verbal contract made for patient to eat full meal after d/c.  Awaiting medication from pharmacy

## 2017-09-15 NOTE — ED Notes (Addendum)
Pharmacy made aware of need for education for patient and need for first dose

## 2017-10-17 ENCOUNTER — Ambulatory Visit: Payer: BLUE CROSS/BLUE SHIELD | Admitting: Adult Health

## 2017-10-17 DIAGNOSIS — Z0289 Encounter for other administrative examinations: Secondary | ICD-10-CM

## 2017-10-18 ENCOUNTER — Ambulatory Visit: Payer: BLUE CROSS/BLUE SHIELD | Admitting: Adult Health

## 2017-10-18 ENCOUNTER — Encounter: Payer: Self-pay | Admitting: Family Medicine

## 2017-10-18 ENCOUNTER — Encounter: Payer: Self-pay | Admitting: Adult Health

## 2017-10-18 VITALS — BP 134/94 | Temp 97.5°F | Wt 247.0 lb

## 2017-10-18 DIAGNOSIS — I82442 Acute embolism and thrombosis of left tibial vein: Secondary | ICD-10-CM | POA: Diagnosis not present

## 2017-10-18 LAB — BASIC METABOLIC PANEL
BUN: 15 mg/dL (ref 6–23)
CO2: 30 mEq/L (ref 19–32)
Calcium: 9.4 mg/dL (ref 8.4–10.5)
Chloride: 104 mEq/L (ref 96–112)
Creatinine, Ser: 1.23 mg/dL (ref 0.40–1.50)
GFR: 80.04 mL/min (ref >60.00)
Glucose, Bld: 88 mg/dL (ref 70–99)
Potassium: 4.5 mEq/L (ref 3.5–5.1)
Sodium: 139 mEq/L (ref 135–145)

## 2017-10-18 MED ORDER — RIVAROXABAN 20 MG PO TABS: 20.0000 mg | ORAL_TABLET | Freq: Every day | ORAL | 1 refills | Status: DC

## 2017-10-18 NOTE — Progress Notes (Signed)
Subjective:    Patient ID: CHICK COUSINS, male    DOB: [birth year], 52 y.o.   MRN: [id number]  HPI  52 year old male who  has a past medical history of Chicken pox. He has not been seen in this office since September 2017.  Was seen in the ER on July 6th 2019 after being diagnosed with a DVT in the left leg at his orthopedic office. Per vascular ultrasound note, there is an acute DVT and left posterior tibial vein. He was started on  Xeralto starter pack and has finished that. Has actually been out of medication for three days at this point.   He denies any chest pain, shortness or breath, or leg pain. Swelling in left leg has started to subside.   Denies any aggravating factors such as long car or plane rides, trauma, or family history.   He has no history of prior blood clot.   Review of Systems See HPI   Past Medical History:  Diagnosis Date  . Chicken pox     Social History   Socioeconomic History  . Marital status: Married    Spouse name: Not on file  . Number of children: Not on file  . Years of education: Not on file  . Highest education level: Not on file  Occupational History  . Not on file  Social Needs  . Financial resource strain: Not on file  . Food insecurity:    Worry: Not on file    Inability: Not on file  . Transportation needs:    Medical: Not on file    Non-medical: Not on file  Tobacco Use  . Smoking status: Never Smoker  . Smokeless tobacco: Never Used  Substance and Sexual Activity  . Alcohol use: No  . Drug use: No  . Sexual activity: Not on file  Lifestyle  . Physical activity:    Days per week: Not on file    Minutes per session: Not on file  . Stress: Not on file  Relationships  . Social connections:    Talks on phone: Not on file    Gets together: Not on file    Attends religious service: Not on file    Active member of club or organization: Not on file    Attends meetings of clubs or organizations: Not on file    Relationship  status: Not on file  . Intimate partner violence:    Fear of current or ex partner: Not on file    Emotionally abused: Not on file    Physically abused: Not on file    Forced sexual activity: Not on file  Other Topics Concern  . Not on file  Social History Narrative   Married for 80 years    One daughters ( 71 years old) Lives local          Likes to ride his motorcycle and weight training.     Past Surgical History:  Procedure Laterality Date  . APPENDECTOMY    . CIRCUMCISION     newborn and at age 78    Family History  Problem Relation Age of Onset  . Stroke Mother   . Hypertension Mother   . Allergies Daughter     No Known Allergies  Current Outpatient Medications on File Prior to Visit  Medication Sig Dispense Refill  . Multiple Vitamins-Minerals (MULTIVITAMIN WITH MINERALS) tablet Take 1 tablet by mouth daily.    . Rivaroxaban 15 & 20 MG TBPK  Take 15 mg by mouth 2 (two) times daily. Take as directed on package: Start with one 15mg  tablet by mouth twice a day with food. On Day 22, switch to one 20mg  tablet once a day with food. 51 each 0   No current facility-administered medications on file prior to visit.     BP (!) 134/94   Temp (!) 97.5 F (36.4 C) (Oral)   Wt 247 lb (112 kg)   BMI 31.29 kg/m       Objective:   Physical Exam  Constitutional: He is oriented to person, place, and time. He appears well-developed and well-nourished. No distress.  Cardiovascular: Normal rate, regular rhythm, normal heart sounds and intact distal pulses. Exam reveals no gallop.  No murmur heard. Pulmonary/Chest: Effort normal and breath sounds normal. No stridor. No respiratory distress. He has no wheezes. He has no rales. He exhibits no tenderness.  Musculoskeletal: He exhibits edema (trace pitting edema in left lower extremity. ).  No left sided calf pain/redness/or warmth   Neurological: He is alert and oriented to person, place, and time.  Skin: Skin is warm and dry. He  is not diaphoretic.  Psychiatric: He has a normal mood and affect. His behavior is normal. Judgment and thought content normal.  Nursing note and vitals reviewed.     Assessment & Plan:  1. Acute deep vein thrombosis (DVT) of tibial vein of left lower extremity (HCC) - We discussed that optimal duration of therapy is unknown and is dependent on many factors, such as whether provoking events were present, patient risk factors for recurrence and bleeding, and individual preferences. We will place him on Xeralto for 6 months and then reassess.  - Basic Metabolic Panel - rivaroxaban (XARELTO) 20 MG TABS tablet; Take 1 tablet (20 mg total) by mouth daily with supper.  Dispense: 90 tablet; Refill: 1  Dorothyann Peng, NP

## 2017-10-18 NOTE — Patient Instructions (Signed)
It was great seeing you again.   I am going to check your kidneys today through blood work and will let you know the results of this.   I have filled your Xarelto for 6 months. Prior to finishing this medication, please follow up with me to discuss stopping this medication   Also follow up with me for your physical

## 2017-11-09 ENCOUNTER — Encounter: Payer: Self-pay | Admitting: Adult Health

## 2017-11-09 ENCOUNTER — Ambulatory Visit: Payer: BLUE CROSS/BLUE SHIELD | Admitting: Adult Health

## 2017-11-09 VITALS — BP 118/76 | Temp 98.0°F | Wt 241.0 lb

## 2017-11-09 DIAGNOSIS — J0140 Acute pansinusitis, unspecified: Secondary | ICD-10-CM | POA: Diagnosis not present

## 2017-11-09 MED ORDER — DOXYCYCLINE HYCLATE 100 MG PO CAPS: 100.0000 mg | ORAL_CAPSULE | Freq: Two times a day (BID) | ORAL | 0 refills | Status: DC

## 2017-11-09 NOTE — Progress Notes (Signed)
Subjective:    Patient ID: Gregory Montgomery, male    DOB: 30-Jun-1965, 52 y.o.   MRN: [id number]  Sinusitis  This is a new problem. The current episode started in the past 7 days. The problem has been gradually worsening since onset. There has been no fever. Associated symptoms include congestion, coughing (productive ), headaches and sinus pressure. Pertinent negatives include no chills, diaphoresis, ear pain, shortness of breath or sore throat. Past treatments include acetaminophen and oral decongestants. The treatment provided mild relief.      Review of Systems  Constitutional: Positive for activity change and fatigue. Negative for appetite change, chills, diaphoresis, fever and unexpected weight change.  HENT: Positive for congestion, nosebleeds, postnasal drip, sinus pressure and sinus pain. Negative for ear pain and sore throat.   Respiratory: Positive for cough (productive ). Negative for chest tightness, shortness of breath and wheezing.   Cardiovascular: Negative.   Musculoskeletal: Negative.   Neurological: Positive for headaches.   Past Medical History:  Diagnosis Date  . Chicken pox     Social History   Socioeconomic History  . Marital status: Married    Spouse name: Not on file  . Number of children: Not on file  . Years of education: Not on file  . Highest education level: Not on file  Occupational History  . Not on file  Social Needs  . Financial resource strain: Not on file  . Food insecurity:    Worry: Not on file    Inability: Not on file  . Transportation needs:    Medical: Not on file    Non-medical: Not on file  Tobacco Use  . Smoking status: Never Smoker  . Smokeless tobacco: Never Used  Substance and Sexual Activity  . Alcohol use: No  . Drug use: No  . Sexual activity: Not on file  Lifestyle  . Physical activity:    Days per week: Not on file    Minutes per session: Not on file  . Stress: Not on file  Relationships  . Social connections:    Talks on phone: Not on file    Gets together: Not on file    Attends religious service: Not on file    Active member of club or organization: Not on file    Attends meetings of clubs or organizations: Not on file    Relationship status: Not on file  . Intimate partner violence:    Fear of current or ex partner: Not on file    Emotionally abused: Not on file    Physically abused: Not on file    Forced sexual activity: Not on file  Other Topics Concern  . Not on file  Social History Narrative   Married for 59 years    One daughters ( 60 years old) Lives local          Likes to ride his motorcycle and weight training.     Past Surgical History:  Procedure Laterality Date  . APPENDECTOMY    . CIRCUMCISION     newborn and at age 58    Family History  Problem Relation Age of Onset  . Stroke Mother   . Hypertension Mother   . Allergies Daughter     No Known Allergies  Current Outpatient Medications on File Prior to Visit  Medication Sig Dispense Refill  . Multiple Vitamins-Minerals (MULTIVITAMIN WITH MINERALS) tablet Take 1 tablet by mouth daily.    . rivaroxaban (XARELTO) 20 MG TABS tablet Take  1 tablet (20 mg total) by mouth daily with supper. 90 tablet 1   No current facility-administered medications on file prior to visit.     BP 118/76   Temp 98 F (36.7 C) (Oral)   Wt 241 lb (109.3 kg)   BMI 30.53 kg/m       Objective:   Physical Exam  Constitutional: He is oriented to person, place, and time. He appears well-developed and well-nourished. He appears ill. No distress.  HENT:  Right Ear: Hearing, tympanic membrane, external ear and ear canal normal.  Left Ear: Hearing, tympanic membrane, external ear and ear canal normal.  Nose: Mucosal edema and rhinorrhea present. Epistaxis (dried blood in left nare ) is observed. Right sinus exhibits maxillary sinus tenderness and frontal sinus tenderness. Left sinus exhibits maxillary sinus tenderness and frontal sinus  tenderness.  Mouth/Throat: Uvula is midline, oropharynx is clear and moist and mucous membranes are normal.  Eyes: Pupils are equal, round, and reactive to light. EOM are normal.  Neck: Normal range of motion. Neck supple.  Cardiovascular: Normal rate, regular rhythm, normal heart sounds and intact distal pulses.  Pulmonary/Chest: Effort normal and breath sounds normal.  Neurological: He is alert and oriented to person, place, and time.  Skin: Skin is warm and dry. He is not diaphoretic.  Psychiatric: He has a normal mood and affect. His behavior is normal. Judgment and thought content normal.  Nursing note and vitals reviewed.     Assessment & Plan:  1. Acute non-recurrent pansinusitis - Stop oral decongestants  - Get normal saline spray  - doxycycline (VIBRAMYCIN) 100 MG capsule; Take 1 capsule (100 mg total) by mouth 2 (two) times daily.  Dispense: 14 capsule; Refill: 0  Dorothyann Peng, NP

## 2017-11-20 ENCOUNTER — Telehealth: Payer: Self-pay | Admitting: Adult Health

## 2017-11-20 NOTE — Telephone Encounter (Signed)
Copied from [place] [id number]. Topic: Quick Communication - Rx Refill/Question >> Nov 20, 2017  1:45 PM Oliver Pila B wrote: Pt called and states he is still struggling w/ a cough; pt would like to know if there is something that he can take to help w/ that; pt was advised to speak w/ a pharmacist on what could be taken

## 2017-11-20 NOTE — Telephone Encounter (Signed)
Spoke to the pt.  He states he went by the pharmacy and was told he can take Delsym with his Xarelto.  He has bought a bottle of that to try tonight.  He will call me tomorrow to let me know how he is doing.  Pt states his cough is dry and hacky.  The cough has not left since last seen by [facility].

## 2017-11-20 NOTE — Telephone Encounter (Signed)
Patient returning missed call. 

## 2017-11-20 NOTE — Telephone Encounter (Signed)
Left a message for a return call.

## 2017-11-23 ENCOUNTER — Ambulatory Visit (INDEPENDENT_AMBULATORY_CARE_PROVIDER_SITE_OTHER): Payer: BLUE CROSS/BLUE SHIELD | Admitting: Adult Health

## 2017-11-23 ENCOUNTER — Encounter: Payer: Self-pay | Admitting: Adult Health

## 2017-11-23 VITALS — BP 134/92 | Temp 97.8°F | Ht 73.75 in | Wt 245.0 lb

## 2017-11-23 DIAGNOSIS — Z0000 Encounter for general adult medical examination without abnormal findings: Secondary | ICD-10-CM | POA: Diagnosis not present

## 2017-11-23 DIAGNOSIS — E782 Mixed hyperlipidemia: Secondary | ICD-10-CM | POA: Diagnosis not present

## 2017-11-23 DIAGNOSIS — Z1211 Encounter for screening for malignant neoplasm of colon: Secondary | ICD-10-CM

## 2017-11-23 DIAGNOSIS — Z125 Encounter for screening for malignant neoplasm of prostate: Secondary | ICD-10-CM

## 2017-11-23 DIAGNOSIS — Z23 Encounter for immunization: Secondary | ICD-10-CM | POA: Diagnosis not present

## 2017-11-23 DIAGNOSIS — I82442 Acute embolism and thrombosis of left tibial vein: Secondary | ICD-10-CM | POA: Diagnosis not present

## 2017-11-23 LAB — PSA: PSA: 0.73 ng/mL (ref 0.10–4.00)

## 2017-11-23 LAB — CBC WITH DIFFERENTIAL/PLATELET
Basophils Absolute: 0 10*3/uL (ref 0.0–0.1)
Basophils Relative: 0.6 % (ref 0.0–3.0)
Eosinophils Absolute: 0.2 10*3/uL (ref 0.0–0.7)
Eosinophils Relative: 5 % (ref 0.0–5.0)
HCT: 47.7 % (ref 39.0–52.0)
Hemoglobin: 16 g/dL (ref 13.0–17.0)
Lymphocytes Relative: 44.1 % (ref 12.0–46.0)
Lymphs Abs: 1.8 10*3/uL (ref 0.7–4.0)
MCHC: 33.6 g/dL (ref 30.0–36.0)
MCV: 84.1 fl (ref 78.0–100.0)
Monocytes Absolute: 0.5 10*3/uL (ref 0.1–1.0)
Monocytes Relative: 11.3 % (ref 3.0–12.0)
Neutro Abs: 1.6 10*3/uL (ref 1.4–7.7)
Neutrophils Relative %: 39 % — ABNORMAL LOW (ref 43.0–77.0)
Platelets: 175 10*3/uL (ref 150.0–400.0)
RBC: 5.68 Mil/uL (ref 4.22–5.81)
RDW: 14 % (ref 11.5–15.5)
WBC: 4 10*3/uL (ref 4.0–10.5)

## 2017-11-23 LAB — BASIC METABOLIC PANEL
BUN: 12 mg/dL (ref 6–23)
CO2: 26 mEq/L (ref 19–32)
Calcium: 9 mg/dL (ref 8.4–10.5)
Chloride: 103 mEq/L (ref 96–112)
Creatinine, Ser: 1.17 mg/dL (ref 0.40–1.50)
GFR: 84.76 mL/min (ref >60.00)
Glucose, Bld: 87 mg/dL (ref 70–99)
Potassium: 4.3 mEq/L (ref 3.5–5.1)
Sodium: 139 mEq/L (ref 135–145)

## 2017-11-23 LAB — LIPID PANEL
Cholesterol: 225 mg/dL — ABNORMAL HIGH (ref 0–200)
HDL: 48.2 mg/dL (ref >39.00)
LDL Cholesterol: 162 mg/dL — ABNORMAL HIGH (ref 0–99)
NonHDL: 176.76
Total CHOL/HDL Ratio: 5
Triglycerides: 73 mg/dL (ref 0.0–149.0)
VLDL: 14.6 mg/dL (ref 0.0–40.0)

## 2017-11-23 LAB — HEPATIC FUNCTION PANEL
ALT: 18 U/L (ref 0–53)
AST: 14 U/L (ref 0–37)
Albumin: 4.1 g/dL (ref 3.5–5.2)
Alkaline Phosphatase: 40 U/L (ref 39–117)
Bilirubin, Direct: 0.2 mg/dL (ref 0.0–0.3)
Total Bilirubin: 1.4 mg/dL — ABNORMAL HIGH (ref 0.2–1.2)
Total Protein: 6.8 g/dL (ref 6.0–8.3)

## 2017-11-23 LAB — TSH: TSH: 3.25 u[IU]/mL (ref 0.35–4.50)

## 2017-11-23 NOTE — Progress Notes (Signed)
Subjective:    Patient ID: Gregory Montgomery, male    DOB: 09-16-1965, 52 y.o.   MRN: [id number]  HPI  Patient presents for yearly preventative medicine examination. He is a pleasant 52 year old male who  has a past medical history of Chicken pox and DVT (deep venous thrombosis) ([place]) (09/2017).  DVT- Diagnosed in July 2019. Currently prescribed Xeralto 20 mg   Hyperlipidemia - not currently prescribed any medication  Lab Results  Component Value Date   CHOL 231 (H) 11/30/2015   HDL 45.40 11/30/2015   LDLCALC 168 (H) 11/30/2015   TRIG 84.0 11/30/2015   CHOLHDL 5 11/30/2015   All immunizations and health maintenance protocols were reviewed with the patient and needed orders were placed. Due for seasonal flu   Appropriate screening laboratory values were ordered for the patient including screening of hyperlipidemia, renal function and hepatic function. If indicated by BPH, a PSA was ordered.  Medication reconciliation,  past medical history, social history, problem list and allergies were reviewed in detail with the patient  Goals were established with regard to weight loss, exercise, and  diet in compliance with medications. He has not been exercising. Does not follow a specific diet.  Wt Readings from Last 3 Encounters:  11/23/17 245 lb (111.1 kg)  11/09/17 241 lb (109.3 kg)  10/18/17 247 lb (112 kg)   He is due for routine colonoscopy. He is up to date on routine dental and vision screens.   Review of Systems  Constitutional: Negative.   HENT: Negative.   Eyes: Negative.   Respiratory: Negative.   Cardiovascular: Negative.   Gastrointestinal: Negative.   Endocrine: Negative.   Genitourinary: Negative.   Musculoskeletal: Negative.   Skin: Negative.   Allergic/Immunologic: Negative.   Neurological: Negative.   Hematological: Negative.   Psychiatric/Behavioral: Negative.   All other systems reviewed and are negative.  Past Medical History:  Diagnosis Date  . Chicken  pox   . DVT (deep venous thrombosis) ([place]) 09/2017    Social History   Socioeconomic History  . Marital status: Married    Spouse name: Not on file  . Number of children: Not on file  . Years of education: Not on file  . Highest education level: Not on file  Occupational History  . Not on file  Social Needs  . Financial resource strain: Not on file  . Food insecurity:    Worry: Not on file    Inability: Not on file  . Transportation needs:    Medical: Not on file    Non-medical: Not on file  Tobacco Use  . Smoking status: Never Smoker  . Smokeless tobacco: Never Used  Substance and Sexual Activity  . Alcohol use: No  . Drug use: No  . Sexual activity: Not on file  Lifestyle  . Physical activity:    Days per week: Not on file    Minutes per session: Not on file  . Stress: Not on file  Relationships  . Social connections:    Talks on phone: Not on file    Gets together: Not on file    Attends religious service: Not on file    Active member of club or organization: Not on file    Attends meetings of clubs or organizations: Not on file    Relationship status: Not on file  . Intimate partner violence:    Fear of current or ex partner: Not on file    Emotionally abused: Not on file  Physically abused: Not on file    Forced sexual activity: Not on file  Other Topics Concern  . Not on file  Social History Narrative   Married for 43 years    One daughters ( 51 years old) Lives local          Likes to ride his motorcycle and weight training.     Past Surgical History:  Procedure Laterality Date  . APPENDECTOMY    . CIRCUMCISION     newborn and at age 87    Family History  Problem Relation Age of Onset  . Stroke Mother   . Hypertension Mother   . Allergies Daughter     No Known Allergies  Current Outpatient Medications on File Prior to Visit  Medication Sig Dispense Refill  . Multiple Vitamins-Minerals (MULTIVITAMIN WITH MINERALS) tablet Take 1 tablet  by mouth daily.    . rivaroxaban (XARELTO) 20 MG TABS tablet Take 1 tablet (20 mg total) by mouth daily with supper. 90 tablet 1   No current facility-administered medications on file prior to visit.     BP (!) 134/92   Temp 97.8 F (36.6 C) (Oral)   Ht 6' 1.75" (1.873 m)   Wt 245 lb (111.1 kg)   BMI 31.67 kg/m       Objective:   Physical Exam  Constitutional: He is oriented to person, place, and time. He appears well-developed and well-nourished. No distress.  HENT:  Head: Normocephalic and atraumatic.  Right Ear: External ear normal.  Left Ear: External ear normal.  Nose: Nose normal.  Mouth/Throat: Oropharynx is clear and moist. No oropharyngeal exudate.  Eyes: Pupils are equal, round, and reactive to light. Conjunctivae and EOM are normal. Right eye exhibits no discharge. Left eye exhibits no discharge. No scleral icterus.  Neck: Normal range of motion. Neck supple. No JVD present. No tracheal deviation present. No thyromegaly present.  Cardiovascular: Normal rate, regular rhythm, normal heart sounds and intact distal pulses. Exam reveals no gallop and no friction rub.  No murmur heard. Pulmonary/Chest: Effort normal and breath sounds normal. No respiratory distress. He has no wheezes. He has no rales. He exhibits no tenderness.  Abdominal: Soft. Bowel sounds are normal. He exhibits no distension and no mass. There is no tenderness. There is no rebound and no guarding. No hernia.  Musculoskeletal: Normal range of motion. He exhibits no edema, tenderness or deformity.  Lymphadenopathy:    He has no cervical adenopathy.  Neurological: He is alert and oriented to person, place, and time. He displays normal reflexes. No cranial nerve deficit or sensory deficit. He exhibits normal muscle tone. Coordination normal.  Skin: Skin is warm and dry. Capillary refill takes less than 2 seconds. No rash noted. He is not diaphoretic. No erythema. No pallor.  Psychiatric: He has a normal mood  and affect. His behavior is normal. Judgment and thought content normal.  Nursing note and vitals reviewed.     Assessment & Plan:  1. Routine general medical examination at a health care facility - Encouraged routine exercise and heart healthy diet.  - Follow up in one year or sooner if needed - Basic metabolic panel - CBC with Differential/Platelet - Hepatic function panel - Lipid panel - TSH  2. Acute deep vein thrombosis (DVT) of tibial vein of left lower extremity (HCC) - Continue with Xeralto   3. Mixed hyperlipidemia - Consider statin  - Basic metabolic panel - CBC with Differential/Platelet - Hepatic function panel - Lipid panel -  TSH - PSA  4. Prostate cancer screening  - PSA  5. Colon cancer screening - Will do cologuard   6. Need for influenza vaccination  - Flu Vaccine QUAD 6+ mos PF IM (Fluarix Quad PF)  Dorothyann Peng, NP

## 2018-03-14 ENCOUNTER — Encounter: Payer: Self-pay | Admitting: Adult Health

## 2018-03-14 ENCOUNTER — Ambulatory Visit: Payer: BLUE CROSS/BLUE SHIELD | Admitting: Adult Health

## 2018-03-14 VITALS — BP 130/92 | Temp 97.6°F | Wt 242.0 lb

## 2018-03-14 DIAGNOSIS — I82442 Acute embolism and thrombosis of left tibial vein: Secondary | ICD-10-CM | POA: Diagnosis not present

## 2018-03-14 NOTE — Progress Notes (Addendum)
Subjective:    Patient ID: Gregory Montgomery, male    DOB: 1965-04-03, 53 y.o.   MRN: [id number]  HPI 53 year old male who  has a past medical history of Chicken pox and DVT (deep venous thrombosis) ([name]) (09/2017).  He presents to the office today for 6 month follow up regarding DVT. He was diagnosed for DVT in JUly 2019 at his orthopedic office. Per vascular ultrasound note, there was acute DVT and left posterior tibial vein. This DVT seemed to be unprovoked. He would like to discuss coming off Xeralto after six months of treatment.   He denies chest pain, shortness of breath, or calf pain.   No history of cancer, non smoker.   Review of Systems See HPI   Past Medical History:  Diagnosis Date  . Chicken pox   . DVT (deep venous thrombosis) ([place]) 09/2017    Social History   Socioeconomic History  . Marital status: Married    Spouse name: Not on file  . Number of children: Not on file  . Years of education: Not on file  . Highest education level: Not on file  Occupational History  . Not on file  Social Needs  . Financial resource strain: Not on file  . Food insecurity:    Worry: Not on file    Inability: Not on file  . Transportation needs:    Medical: Not on file    Non-medical: Not on file  Tobacco Use  . Smoking status: Never Smoker  . Smokeless tobacco: Never Used  Substance and Sexual Activity  . Alcohol use: No  . Drug use: No  . Sexual activity: Not on file  Lifestyle  . Physical activity:    Days per week: Not on file    Minutes per session: Not on file  . Stress: Not on file  Relationships  . Social connections:    Talks on phone: Not on file    Gets together: Not on file    Attends religious service: Not on file    Active member of club or organization: Not on file    Attends meetings of clubs or organizations: Not on file    Relationship status: Not on file  . Intimate partner violence:    Fear of current or ex partner: Not on file    Emotionally  abused: Not on file    Physically abused: Not on file    Forced sexual activity: Not on file  Other Topics Concern  . Not on file  Social History Narrative   Married for 35 years    One daughters ( 82 years old) Lives local          Likes to ride his motorcycle and weight training.     Past Surgical History:  Procedure Laterality Date  . APPENDECTOMY    . CIRCUMCISION     newborn and at age 48    Family History  Problem Relation Age of Onset  . Stroke Mother   . Hypertension Mother   . Allergies Daughter     No Known Allergies  Current Outpatient Medications on File Prior to Visit  Medication Sig Dispense Refill  . Multiple Vitamins-Minerals (MULTIVITAMIN WITH MINERALS) tablet Take 1 tablet by mouth daily.    . rivaroxaban (XARELTO) 20 MG TABS tablet Take 1 tablet (20 mg total) by mouth daily with supper. 90 tablet 1   No current facility-administered medications on file prior to visit.  BP (!) 130/92   Temp 97.6 F (36.4 C)   Wt 242 lb (109.8 kg)   BMI 31.28 kg/m       Objective:   Physical Exam Vitals signs and nursing note reviewed.  Constitutional:      General: He is not in acute distress.    Appearance: He is not toxic-appearing.  Cardiovascular:     Rate and Rhythm: Normal rate and regular rhythm.     Pulses: Normal pulses.     Heart sounds: Normal heart sounds.  Pulmonary:     Effort: Pulmonary effort is normal.     Breath sounds: Normal breath sounds.  Musculoskeletal: Normal range of motion.        General: No swelling, tenderness, deformity or signs of injury.     Right lower leg: No edema.     Left lower leg: No edema.  Skin:    Capillary Refill: Capillary refill takes less than 2 seconds.  Neurological:     General: No focal deficit present.     Mental Status: He is alert.       Assessment & Plan:  1. Acute deep vein thrombosis (DVT) of tibial vein of left lower extremity (HCC) - We discussed options and he is ok with seeing  hematology for further testing.  - Will refer to hematology due to DVT being unprovoked.  - Ambulatory referral to Hematology - Follow up as needed  Greater than 50% of 30 minutes was spent face to face with patient, counseling and/or coordinating care in regards to need for specialist evaluation and medication management    Dorothyann Peng, NP

## 2018-03-19 ENCOUNTER — Ambulatory Visit: Payer: BLUE CROSS/BLUE SHIELD | Admitting: Adult Health

## 2018-03-21 ENCOUNTER — Encounter: Payer: Self-pay | Admitting: Hematology and Oncology

## 2018-04-11 ENCOUNTER — Telehealth: Payer: Self-pay | Admitting: Hematology and Oncology

## 2018-04-11 NOTE — Telephone Encounter (Signed)
Lft vm to reschedule appt °

## 2018-04-11 NOTE — Telephone Encounter (Signed)
Pt's new pt appt has been rescheduled to see Dr. Lindi Adie on 2/20 at 345pm. Pt is aware of the appt date and time.

## 2018-04-15 ENCOUNTER — Encounter: Payer: BLUE CROSS/BLUE SHIELD | Admitting: Hematology and Oncology

## 2018-04-18 ENCOUNTER — Other Ambulatory Visit: Payer: Self-pay | Admitting: Adult Health

## 2018-04-18 DIAGNOSIS — I82442 Acute embolism and thrombosis of left tibial vein: Secondary | ICD-10-CM

## 2018-04-19 NOTE — Telephone Encounter (Signed)
Sent to the pharmacy by e-scribe. 

## 2018-05-01 NOTE — Progress Notes (Signed)
[facility] CONSULT NOTE  Patient Care Team: Dorothyann Peng, NP as PCP - General (Family Medicine) Guest, Benn Moulder, MD (Internal Medicine)  CHIEF COMPLAINTS/PURPOSE OF CONSULTATION: Newly diagnosed acute deep vein thrombosis  HISTORY OF PRESENTING ILLNESS:  Gregory Montgomery 53 y.o. male is here because of recent diagnosis of acute, unprovoked DVT. On 09/15/17 he presented to the ED for left lower leg pain and swelling for three weeks prior and was diagnosed with a DVT that was confirmed on an [place]. He was put on Xarelto at that time and is currently on it. He presents to the clinic alone today. He denies any pain in his left leg but notes swelling in the area of clot that is worse at night and causes him to elevate his foot at night. He notes he runs and walks frequently, plays basketball and baseball avidly, and rides a motorcycle occasionally. He first noticed pain prior to his DVT while doing normal activities. He doesn't push himself in exercise as much anymore. He denies any family history of blood clots. 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 He denies taking any testosterone replacement therapy, prior surgeries, or a history of smoking.   I reviewed her records extensively and collaborated the history with the patient.  MEDICAL HISTORY:  Past Medical History:  Diagnosis Date  . Chicken pox   . DVT (deep venous thrombosis) ([place]) 09/2017    SURGICAL HISTORY: Past Surgical History:  Procedure Laterality Date  . APPENDECTOMY    . CIRCUMCISION     newborn and at age 53    SOCIAL HISTORY: Social History   Socioeconomic History  . Marital status: Married    Spouse name: Not on file  . Number of children: Not on file  . Years of education: Not on file  . Highest education level: Not on file  Occupational History  . Not on file  Social Needs  . Financial resource strain: Not on file  . Food insecurity:    Worry:  Not on file    Inability: Not on file  . Transportation needs:    Medical: Not on file    Non-medical: Not on file  Tobacco Use  . Smoking status: Never Smoker  . Smokeless tobacco: Never Used  Substance and Sexual Activity  . Alcohol use: No  . Drug use: No  . Sexual activity: Not on file  Lifestyle  . Physical activity:    Days per week: Not on file    Minutes per session: Not on file  . Stress: Not on file  Relationships  . Social connections:    Talks on phone: Not on file    Gets together: Not on file    Attends religious service: Not on file    Active member of club or organization: Not on file    Attends meetings of clubs or organizations: Not on file    Relationship status: Not on file  . Intimate partner violence:    Fear of current or ex partner: Not on file    Emotionally abused: Not on file    Physically abused: Not on file    Forced sexual activity: Not on file  Other Topics Concern  . Not on file  Social History Narrative   Married for 66 years    One daughters ( 1 years old) Lives local          Likes to ride  his motorcycle and weight training.     FAMILY HISTORY: Family History  Problem Relation Age of Onset  . Stroke Mother   . Hypertension Mother   . Allergies Daughter     ALLERGIES:  has No Known Allergies.  MEDICATIONS:  Current Outpatient Medications  Medication Sig Dispense Refill  . Multiple Vitamins-Minerals (MULTIVITAMIN WITH MINERALS) tablet Take 1 tablet by mouth daily.    Alveda Reasons 20 MG TABS tablet TAKE 1 TABLET (20 MG TOTAL) BY MOUTH DAILY WITH SUPPER. 90 tablet 1   No current facility-administered medications for this visit.     REVIEW OF SYSTEMS:   Constitutional: Denies fevers, chills or abnormal night sweats Eyes: Denies blurriness of vision, double vision or watery eyes Ears, nose, mouth, throat, and face: Denies mucositis or sore throat Respiratory: Denies cough, dyspnea or wheezes Cardiovascular: Denies palpitation,  chest discomfort or lower extremity swelling Gastrointestinal:  Denies nausea, heartburn or change in bowel habits Skin: Denies abnormal skin rashes Lymphatics: Denies new lymphadenopathy or easy bruising Neurological:Denies numbness, tingling or new weaknesses Behavioral/Psych: Mood is stable, no new changes  Extremities: (+) left lower leg edema All other systems were reviewed with the patient and are negative.  PHYSICAL EXAMINATION: ECOG PERFORMANCE STATUS: 1 - Symptomatic but completely ambulatory  Vitals:   05/02/18 1523  BP: (!) 129/91  Pulse: 62  Resp: 18  Temp: 97.6 F (36.4 C)  SpO2: 100%   Filed Weights   05/02/18 1523  Weight: 245 lb 11.2 oz (111.4 kg)    GENERAL:alert, no distress and comfortable SKIN: skin color, texture, turgor are normal, no rashes or significant lesions EYES: normal, conjunctiva are pink and non-injected, sclera clear OROPHARYNX:no exudate, no erythema and lips, buccal mucosa, and tongue normal  NECK: supple, thyroid normal size, non-tender, without nodularity LYMPH:  no palpable lymphadenopathy in the cervical, axillary or inguinal LUNGS: clear to auscultation and percussion with normal breathing effort HEART: regular rate & rhythm and no murmurs and no lower extremity edema ABDOMEN:abdomen soft, non-tender and normal bowel sounds Musculoskeletal: no cyanosis of digits and no clubbing  PSYCH: alert & oriented x 3 with fluent speech NEURO: no focal motor/sensory deficits EXTREMITIES: edema of left lower leg  LABORATORY DATA:  I have reviewed the data as listed Lab Results  Component Value Date   WBC 4.0 11/23/2017   HGB 16.0 11/23/2017   HCT 47.7 11/23/2017   MCV 84.1 11/23/2017   PLT 175.0 11/23/2017   Lab Results  Component Value Date   NA 139 11/23/2017   K 4.3 11/23/2017   CL 103 11/23/2017   CO2 26 11/23/2017    RADIOGRAPHIC STUDIES: I have personally reviewed the radiological reports and agreed with the findings in the  report.  ASSESSMENT AND PLAN:  Acute deep vein thrombosis (DVT) of tibial vein of left lower extremity ([place]) 09/15/2017: Acute DVT posterior tibial vein on the left Single unprovoked DVT I discussed with the patient risk factors for blood clots.  Patient does not have any inherited or acquired risk factors.  Workup recommended: Bloodwork to evaluate for antiphospholipid antibodies. Check d-dimer and vascular ultrasound.  If the d-dimer is negative and the vascular ultrasound does not show any evidence of persistent blood clot and he can discontinue anticoagulation.  If however there is evidence of a DVT or if his lupus anticoagulant test comes back positive then he will need to remain on blood thinners any longer. Return to clinic in one week to discuss the results of the  tests  This nice all questions were answered. The patient knows to call the clinic with any problems, questions or concerns.   Nicholas Lose, MD 05/02/2018   Julious Oka Dorshimer, am acting as scribe for Nicholas Lose, MD.  I have reviewed the above documentation for accuracy and completeness, and I agree with the above.

## 2018-05-02 ENCOUNTER — Inpatient Hospital Stay: Payer: BLUE CROSS/BLUE SHIELD

## 2018-05-02 ENCOUNTER — Inpatient Hospital Stay: Payer: BLUE CROSS/BLUE SHIELD | Attending: Hematology and Oncology | Admitting: Hematology and Oncology

## 2018-05-02 DIAGNOSIS — Z7901 Long term (current) use of anticoagulants: Secondary | ICD-10-CM | POA: Diagnosis not present

## 2018-05-02 DIAGNOSIS — Z8249 Family history of ischemic heart disease and other diseases of the circulatory system: Secondary | ICD-10-CM | POA: Diagnosis not present

## 2018-05-02 DIAGNOSIS — I82442 Acute embolism and thrombosis of left tibial vein: Secondary | ICD-10-CM

## 2018-05-02 DIAGNOSIS — Z86718 Personal history of other venous thrombosis and embolism: Secondary | ICD-10-CM | POA: Insufficient documentation

## 2018-05-02 LAB — D-DIMER, QUANTITATIVE: D-Dimer, Quant: <0.27 ug/mL-FEU (ref 0.00–0.50)

## 2018-05-02 NOTE — Assessment & Plan Note (Addendum)
09/15/2017: Acute DVT posterior tibial vein on the left Single unprovoked DVT I discussed with the patient risk factors for blood clots.  Inherited risk factors include: 1. Factor V Leiden mutation 2. Prothrombin gene G20210A 3. Protein S deficiency  4. Protein C deficiency  5. Antithrombin deficiency  Acquired risk factors include: 1. Antiphospholipid antibody syndrome 2. Tobacco use 3. Obesity 4. Sedentary behavior including postoperative state 5. Foreign bodies in circulation  Workup recommended: Bloodwork to evaluate for the 5 inherited factors mentioned above along with antiphospholipid antibodies. Return to clinic in one to 2 weeks to discuss the results of the tests  If patient does not have any hypercoagulable risk factors, we can stop anticoagulation since this is his first DVT and also it is below the knee.

## 2018-05-03 LAB — CARDIOLIPIN ANTIBODIES, IGG, IGM, IGA
Anticardiolipin IgA: <9 APL U/mL (ref 0–11)
Anticardiolipin IgG: <9 GPL U/mL (ref 0–14)
Anticardiolipin IgM: <9 MPL U/mL (ref 0–12)

## 2018-05-04 LAB — LUPUS ANTICOAGULANT PANEL
DRVVT: 76.3 s — ABNORMAL HIGH (ref 0.0–47.0)
PTT Lupus Anticoagulant: 35.1 s (ref 0.0–51.9)

## 2018-05-04 LAB — DRVVT CONFIRM: dRVVT Confirm: 1.5 ratio — ABNORMAL HIGH (ref 0.8–1.2)

## 2018-05-04 LAB — DRVVT MIX: dRVVT Mix: 56.8 s — ABNORMAL HIGH (ref 0.0–47.0)

## 2018-05-06 LAB — BETA-2-GLYCOPROTEIN I ABS, IGG/M/A
Beta-2 Glyco I IgG: <9 GPI IgG units (ref 0–20)
Beta-2-Glycoprotein I IgA: <9 GPI IgA units (ref 0–25)
Beta-2-Glycoprotein I IgM: <9 GPI IgM units (ref 0–32)

## 2018-05-09 ENCOUNTER — Encounter (HOSPITAL_COMMUNITY): Payer: BLUE CROSS/BLUE SHIELD

## 2018-05-09 ENCOUNTER — Ambulatory Visit (HOSPITAL_COMMUNITY)
Admission: RE | Admit: 2018-05-09 | Discharge: 2018-05-09 | Disposition: A | Discharge status: Home / Self Care | Payer: BLUE CROSS/BLUE SHIELD | Source: Ambulatory Visit | Attending: Hematology and Oncology | Admitting: Hematology and Oncology

## 2018-05-09 DIAGNOSIS — I82442 Acute embolism and thrombosis of left tibial vein: Secondary | ICD-10-CM | POA: Insufficient documentation

## 2018-05-09 NOTE — Progress Notes (Signed)
Left lower extremity venous duplex completed. Refer to "CV Proc" under chart review to view preliminary results.  05/09/2018 4:48 PM Maudry Mayhew, MHA, RVT, RDCS, RDMS

## 2018-05-13 ENCOUNTER — Inpatient Hospital Stay: Payer: BLUE CROSS/BLUE SHIELD | Attending: Hematology and Oncology | Admitting: Hematology and Oncology

## 2018-05-13 NOTE — Assessment & Plan Note (Addendum)
09/15/2017: Acute DVT posterior tibial vein on the left Single unprovoked DVT Lupus anticoagulant: Positive Specific antibodies like anticardiolipin antibody and anti-GP 1 antibody were negative  Discussion: I explained to the patient great detail the mechanism of lupus anticoagulant and antiphospholipid antibodies.  Patient's who are retested and positive have the confirmed diagnosis of antiphospholipid antibody syndrome.  These patients need to remain on blood thinners for life.  Treatment: 1.  Continue Xarelto 2.  Recheck lupus anticoagulant in 3 months and follow-up after that. Only persistently positive antibodies tend to make the diagnosis of antiphospholipid antibody syndrome.  Return to clinic in 3 months after labs.

## 2018-05-22 ENCOUNTER — Other Ambulatory Visit: Payer: Self-pay

## 2018-05-22 DIAGNOSIS — I82442 Acute embolism and thrombosis of left tibial vein: Secondary | ICD-10-CM

## 2018-08-14 ENCOUNTER — Telehealth: Payer: Self-pay | Admitting: Hematology and Oncology

## 2018-08-14 ENCOUNTER — Telehealth: Payer: Self-pay

## 2018-08-14 NOTE — Telephone Encounter (Signed)
RN returned call regarding scheduling follow up appointment.   RN left voicemail informing scheduling staff will be notified to schedule appointments.

## 2018-08-14 NOTE — Telephone Encounter (Signed)
Scheduled appt per 6/3 sch message - pt is aware of appt date and time.  

## 2018-09-06 NOTE — Assessment & Plan Note (Signed)
09/15/2017: Acute DVT posterior tibial vein on the left Single unprovoked DVT I discussed with the patient risk factors for blood clots.  Patient does not have any inherited risk factors. Lupus anticoagulant positive  Plan: Recheck for lupus anticoagulant to confirm the results. If it is persistently positive then he needs to remain on blood thinners for life.

## 2018-09-11 ENCOUNTER — Ambulatory Visit: Payer: BLUE CROSS/BLUE SHIELD | Admitting: Emergency Medicine

## 2018-09-11 NOTE — Progress Notes (Signed)
Patient Care Team: Dorothyann Peng, NP as PCP - General (Family Medicine) Guest, Benn Moulder, MD (Internal Medicine)  DIAGNOSIS:    ICD-10-CM   1. Acute deep vein thrombosis (DVT) of tibial vein of left lower extremity (HCC)  I82.442     CHIEF COMPLIANT: Follow-up of acute DVT  INTERVAL HISTORY: Gregory Montgomery is a 53 y.o. with above-mentioned history of acute, unprovoked DVT who is currently on Xarelto. Workup on 05/02/18 showed: beta-2 glycoprotein <2, cardiolipin antibodies negative, d-dimer <0.27, lupus anticoagulant positive, dRVVT Mix 56.8, dRVVT confirm 1.5. He presents to the clinic today to review recent labs and discuss further anticoagulation therapy.   REVIEW OF SYSTEMS:   Constitutional: Denies fevers, chills or abnormal weight loss Eyes: Denies blurriness of vision Ears, nose, mouth, throat, and face: Denies mucositis or sore throat Respiratory: Denies cough, dyspnea or wheezes Cardiovascular: Denies palpitation, chest discomfort Gastrointestinal: Denies nausea, heartburn or change in bowel habits Skin: Denies abnormal skin rashes Lymphatics: Denies new lymphadenopathy or easy bruising Neurological: Denies numbness, tingling or new weaknesses Behavioral/Psych: Mood is stable, no new changes  Extremities: No lower extremity edema All other systems were reviewed with the patient and are negative.  I have reviewed the past medical history, past surgical history, social history and family history with the patient and they are unchanged from previous note.  ALLERGIES:  has No Known Allergies.  MEDICATIONS:  Current Outpatient Medications  Medication Sig Dispense Refill  . Multiple Vitamins-Minerals (MULTIVITAMIN WITH MINERALS) tablet Take 1 tablet by mouth daily.    Alveda Reasons 20 MG TABS tablet TAKE 1 TABLET (20 MG TOTAL) BY MOUTH DAILY WITH SUPPER. 90 tablet 1   No current facility-administered medications for this visit.     PHYSICAL EXAMINATION: ECOG PERFORMANCE  STATUS: 1 - Symptomatic but completely ambulatory  Vitals:   09/12/18 1408  BP: 119/78  Pulse: 65  Resp: 20  Temp: 98 F (36.7 C)  SpO2: 100%   Filed Weights   09/12/18 1408  Weight: 242 lb 12.8 oz (110.1 kg)    GENERAL: alert, no distress and comfortable SKIN: skin color, texture, turgor are normal, no rashes or significant lesions EYES: normal, Conjunctiva are pink and non-injected, sclera clear OROPHARYNX: no exudate, no erythema and lips, buccal mucosa, and tongue normal  NECK: supple, thyroid normal size, non-tender, without nodularity LYMPH: no palpable lymphadenopathy in the cervical, axillary or inguinal LUNGS: clear to auscultation and percussion with normal breathing effort HEART: regular rate & rhythm and no murmurs and no lower extremity edema ABDOMEN: abdomen soft, non-tender and normal bowel sounds MUSCULOSKELETAL: no cyanosis of digits and no clubbing  NEURO: alert & oriented x 3 with fluent speech, no focal motor/sensory deficits EXTREMITIES: No lower extremity edema  LABORATORY DATA:  I have reviewed the data as listed CMP Latest Ref Rng & Units 11/23/2017 10/18/2017 09/15/2017  Glucose 70 - 99 mg/dL 87 88 86  BUN 6 - 23 mg/dL 12 15 14   Creatinine 0.40 - 1.50 mg/dL 1.17 1.23 1.20  Sodium 135 - 145 mEq/L 139 139 140  Potassium 3.5 - 5.1 mEq/L 4.3 4.5 4.5  Chloride 96 - 112 mEq/L 103 104 102  CO2 19 - 32 mEq/L 26 30 -  Calcium 8.4 - 10.5 mg/dL 9.0 9.4 -  Total Protein 6.0 - 8.3 g/dL 6.8 - -  Total Bilirubin 0.2 - 1.2 mg/dL 1.4(H) - -  Alkaline Phos 39 - 117 U/L 40 - -  AST 0 - 37 U/L 14 - -  ALT 0 - 53 U/L 18 - -    Lab Results  Component Value Date   WBC 4.0 11/23/2017   HGB 16.0 11/23/2017   HCT 47.7 11/23/2017   MCV 84.1 11/23/2017   PLT 175.0 11/23/2017   NEUTROABS 1.6 11/23/2017    ASSESSMENT & PLAN:  Acute deep vein thrombosis (DVT) of tibial vein of left lower extremity ([place]) 09/15/2017: Acute DVT posterior tibial vein on the left Single  unprovoked DVT I discussed with the patient risk factors for blood clots.  Current treatment: Xarelto Xarelto adverse effect: Erectile dysfunction.  Patient does not have any inherited risk factors. February 2020: Lupus anticoagulant positive  Plan: Recheck for lupus anticoagulant to confirm the results.  Today levels have been drawn. If it is persistently positive then he needs to remain on blood thinners for life. I will call him with the results of this test. If it is negative then he can stop Xarelto. If it is positive then we can switch him from Xarelto to Eliquis.  He is hoping that this change might avoid ED symptoms which she attributes to Xarelto.  Return to clinic in 1 year for follow-up  No orders of the defined types were placed in this encounter.  The patient has a good understanding of the overall plan. he agrees with it. he will call with any problems that may develop before the next visit here.  Nicholas Lose, MD 09/12/2018  Julious Oka Dorshimer am acting as scribe for Dr. Nicholas Lose.  I have reviewed the above documentation for accuracy and completeness, and I agree with the above.

## 2018-09-12 ENCOUNTER — Other Ambulatory Visit: Payer: Self-pay

## 2018-09-12 ENCOUNTER — Inpatient Hospital Stay: Payer: BC Managed Care – PPO

## 2018-09-12 ENCOUNTER — Inpatient Hospital Stay: Payer: BC Managed Care – PPO | Attending: Hematology and Oncology | Admitting: Hematology and Oncology

## 2018-09-12 DIAGNOSIS — Z7901 Long term (current) use of anticoagulants: Secondary | ICD-10-CM | POA: Diagnosis not present

## 2018-09-12 DIAGNOSIS — Z86718 Personal history of other venous thrombosis and embolism: Secondary | ICD-10-CM

## 2018-09-12 DIAGNOSIS — I82442 Acute embolism and thrombosis of left tibial vein: Secondary | ICD-10-CM | POA: Diagnosis not present

## 2018-09-14 LAB — LUPUS ANTICOAGULANT PANEL
DRVVT: 101.7 s — ABNORMAL HIGH (ref 0.0–47.0)
PTT Lupus Anticoagulant: 37.4 s (ref 0.0–51.9)

## 2018-09-14 LAB — DRVVT MIX: dRVVT Mix: 67.1 s — ABNORMAL HIGH (ref 0.0–47.0)

## 2018-09-14 LAB — DRVVT CONFIRM: dRVVT Confirm: 1.7 ratio — ABNORMAL HIGH (ref 0.8–1.2)

## 2018-09-16 ENCOUNTER — Telehealth: Payer: Self-pay | Admitting: Hematology and Oncology

## 2018-09-16 ENCOUNTER — Telehealth: Payer: Self-pay

## 2018-09-16 ENCOUNTER — Other Ambulatory Visit: Payer: Self-pay | Admitting: Hematology and Oncology

## 2018-09-16 MED ORDER — APIXABAN 5 MG PO TABS: 5.0000 mg | ORAL_TABLET | Freq: Two times a day (BID) | ORAL | 11 refills | Status: DC

## 2018-09-16 NOTE — Telephone Encounter (Signed)
RN returned call and reviewed Dr. Geralyn Flash previous encounter with patient.  RN answered all questions.    Pt will notify clinic if Rx for Eliquis is to expensive and we will provider resources as necessary.

## 2018-09-16 NOTE — Telephone Encounter (Signed)
I left a voicemail for the patient that the antiphospholipid antibody test came back positive for lupus anticoagulant.  Therefore he needs to stay on anticoagulation for life. Because he attributes erectile dysfunction to Xarelto, I recommended switching him from Xarelto to Eliquis. I sent him a new prescription for Eliquis 5 mg p.o. twice daily. I will see him back in 1 year or sooner if he needs me to see him.

## 2018-10-23 ENCOUNTER — Other Ambulatory Visit: Payer: Self-pay | Admitting: Adult Health

## 2018-10-23 DIAGNOSIS — I82442 Acute embolism and thrombosis of left tibial vein: Secondary | ICD-10-CM

## 2018-10-23 NOTE — Telephone Encounter (Signed)
Denied.  Pt no longer taking this medication.

## 2019-05-07 ENCOUNTER — Telehealth: Payer: Self-pay | Admitting: *Deleted

## 2019-05-07 ENCOUNTER — Other Ambulatory Visit: Payer: Self-pay

## 2019-05-07 ENCOUNTER — Ambulatory Visit (HOSPITAL_COMMUNITY)
Admission: RE | Admit: 2019-05-07 | Discharge: 2019-05-07 | Disposition: A | Discharge status: Home / Self Care | Payer: BC Managed Care – PPO | Source: Ambulatory Visit | Attending: Hematology and Oncology | Admitting: Hematology and Oncology

## 2019-05-07 ENCOUNTER — Other Ambulatory Visit: Payer: Self-pay | Admitting: *Deleted

## 2019-05-07 DIAGNOSIS — I82442 Acute embolism and thrombosis of left tibial vein: Secondary | ICD-10-CM

## 2019-05-07 NOTE — Progress Notes (Signed)
Left lower extremity venous duplex has been completed. Preliminary results can be found in CV Proc through chart review.  Results were given to [facility] at Dr. Geralyn Flash office.  05/07/19 1:27 PM Gregory Montgomery RVT

## 2019-05-07 NOTE — Telephone Encounter (Signed)
Pt lower extremity US resulted negative for DVT.  Per MD pt instructed to rest, elevate his legs at night, apply a heating pack, and take ibuprofen as directed on the bottle.  If leg pain does not subside, pt instructed to follow up with PCP for further evaluation.  Pt verbalized understanding and appreciative of the advice.

## 2019-05-07 NOTE — Progress Notes (Signed)
Received call from pt stating he has been experiencing left leg pain and discoloration starting Saturday 05/03/19.  Pt states pain is a constant ache and is located in his left ankle, calf, and hamstring area.  Pt also states there is a discoloration to his skin. Per MD pt to receive VAS [place] to r/o DVT.  Orders placed as STAT.  Waiting for prior authorization to schedule pt.

## 2019-09-24 ENCOUNTER — Other Ambulatory Visit: Payer: Self-pay | Admitting: Hematology and Oncology

## 2019-10-18 ENCOUNTER — Encounter: Payer: Self-pay | Admitting: Emergency Medicine

## 2019-11-05 ENCOUNTER — Ambulatory Visit: Payer: Self-pay

## 2019-11-05 ENCOUNTER — Other Ambulatory Visit: Payer: Self-pay

## 2019-11-05 ENCOUNTER — Encounter: Payer: Self-pay | Admitting: Adult Health

## 2019-11-05 ENCOUNTER — Ambulatory Visit (INDEPENDENT_AMBULATORY_CARE_PROVIDER_SITE_OTHER): Payer: BC Managed Care – PPO | Admitting: Adult Health

## 2019-11-05 VITALS — BP 120/80 | HR 80 | Temp 98.6°F | Ht 73.75 in | Wt 243.0 lb

## 2019-11-05 DIAGNOSIS — T63461A Toxic effect of venom of wasps, accidental (unintentional), initial encounter: Secondary | ICD-10-CM | POA: Diagnosis not present

## 2019-11-05 MED ORDER — PREDNISONE 20 MG PO TABS: 20.0000 mg | ORAL_TABLET | Freq: Every day | ORAL | 0 refills | Status: DC

## 2019-11-05 MED ORDER — METHYLPREDNISOLONE ACETATE 80 MG/ML IJ SUSP
80.0000 mg | Freq: Once | INTRAMUSCULAR | Status: AC
  Administered 2019-11-05: 80 mg via INTRAMUSCULAR

## 2019-11-05 NOTE — Telephone Encounter (Signed)
Patient called and I asked is who is his PCP, he says he goes to [facility]. I called the office and spoke to [place], [employer] and she asked to speak to the patient. The call was connected successfully.     Summary: Call back request    Pt was stung thrice by yellow jackets (Finger, thigh, and hip) on Monday. Pt reported pain/swelling. Held for triage for 6 minutes   Best contact: [phone]

## 2019-11-05 NOTE — Progress Notes (Signed)
Subjective:    Patient ID: Gregory Montgomery, male    DOB: May 12, 1965, 54 y.o.   MRN: [id number]  HPI 54 year old male who  has a past medical history of Chicken pox and DVT (deep venous thrombosis) ([place]) (09/2017).  He presents to the office today for an acute issues. He reports that two days ago he was stung by yellow jackets on the left  hand, left hip, and inside of left leg while mowing the yard. He has been taking Benadryl over the last two days but continues to have swelling and welts to the areas he was stung as well as itching and pain   Denies facial swelling, shortness of breath, difficulty breathing or wheezing.    Review of Systems See HPI   Past Medical History:  Diagnosis Date  . Chicken pox   . DVT (deep venous thrombosis) ([place]) 09/2017    Social History   Socioeconomic History  . Marital status: Married    Spouse name: Not on file  . Number of children: Not on file  . Years of education: Not on file  . Highest education level: Not on file  Occupational History  . Not on file  Tobacco Use  . Smoking status: Never Smoker  . Smokeless tobacco: Never Used  Vaping Use  . Vaping Use: Never used  Substance and Sexual Activity  . Alcohol use: No  . Drug use: No  . Sexual activity: Not on file  Other Topics Concern  . Not on file  Social History Narrative   Married for 32 years    One daughters ( 69 years old) Lives local          Likes to ride his motorcycle and weight training.    Social Determinants of Health   Financial Resource Strain:   . Difficulty of Paying Living Expenses: Not on file  Food Insecurity:   . Worried About Charity fundraiser in the Last Year: Not on file  . Ran Out of Food in the Last Year: Not on file  Transportation Needs:   . Lack of Transportation (Medical): Not on file  . Lack of Transportation (Non-Medical): Not on file  Physical Activity:   . Days of Exercise per Week: Not on file  . Minutes of Exercise per Session: Not  on file  Stress:   . Feeling of Stress : Not on file  Social Connections:   . Frequency of Communication with Friends and Family: Not on file  . Frequency of Social Gatherings with Friends and Family: Not on file  . Attends Religious Services: Not on file  . Active Member of Clubs or Organizations: Not on file  . Attends Archivist Meetings: Not on file  . Marital Status: Not on file  Intimate Partner Violence:   . Fear of Current or Ex-Partner: Not on file  . Emotionally Abused: Not on file  . Physically Abused: Not on file  . Sexually Abused: Not on file    Past Surgical History:  Procedure Laterality Date  . APPENDECTOMY    . CIRCUMCISION     newborn and at age 78    Family History  Problem Relation Age of Onset  . Stroke Mother   . Hypertension Mother   . Allergies Daughter     No Known Allergies  Current Outpatient Medications on File Prior to Visit  Medication Sig Dispense Refill  . ELIQUIS 5 MG TABS tablet TAKE 1 TABLET BY  MOUTH TWICE A DAY 60 tablet 11  . Multiple Vitamins-Minerals (MULTIVITAMIN WITH MINERALS) tablet Take 1 tablet by mouth daily.     No current facility-administered medications on file prior to visit.    BP 120/80 (BP Location: Left Arm, Patient Position: Sitting, Cuff Size: Large)   Pulse 80   Temp 98.6 F (37 C) (Oral)   Ht 6' 1.75" (1.873 m)   Wt 243 lb (110.2 kg)   SpO2 96%   BMI 31.41 kg/m       Objective:   Physical Exam Vitals and nursing note reviewed.  Constitutional:      Appearance: Normal appearance.  Cardiovascular:     Rate and Rhythm: Normal rate and regular rhythm.     Pulses: Normal pulses.     Heart sounds: Normal heart sounds.  Pulmonary:     Effort: Pulmonary effort is normal.     Breath sounds: Normal breath sounds.  Musculoskeletal:        General: Swelling present.     Comments: Swelling noted to ring finger on left hand as well as dorsal aspect of left hand. He has trace swelling noted to  left hip and swelling noted to left inner thigh   Skin:    General: Skin is warm and dry.     Capillary Refill: Capillary refill takes less than 2 seconds.     Findings: Erythema present.     Comments: Large raised welt noted on left inner leg   Neurological:     General: No focal deficit present.     Mental Status: He is alert and oriented to person, place, and time.       Assessment & Plan:  1. Yellow jacket sting, accidental or unintentional, initial encounter  - methylPREDNISolone acetate (DEPO-MEDROL) injection 80 mg - predniSONE (DELTASONE) 20 MG tablet; Take 1 tablet (20 mg total) by mouth daily with breakfast.  Dispense: 5 tablet; Refill: 0 - Follow up if no improvement in the next 2-3 days   Dorothyann Peng, NP

## 2020-01-02 ENCOUNTER — Encounter: Payer: BC Managed Care – PPO | Admitting: Adult Health

## 2020-01-20 ENCOUNTER — Encounter: Payer: BC Managed Care – PPO | Admitting: Adult Health

## 2020-01-20 NOTE — Progress Notes (Deleted)
Subjective:    Patient ID: Gregory Montgomery, male    DOB: [birth year], 54 y.o.   MRN: [id number]  HPI Patient presents for yearly preventative medicine examination. He is a pleasant 54 year old male who  has a past medical history of Chicken pox and DVT (deep venous thrombosis) ([place]) (09/2017).   His last CPE was in 11/2017   History of DVT -in July 2019 he had acute DVT posterior tibial vein on the left.  This was a unprovoked DVT.  At this time he was placed on Xarelto and has been worked up by oncology.  Did have a positive lupus anticoagulant lab and the decision was made to keep him on blood thinners for life.  He was switched from Xarelto to Eliquis 5 mg twice daily  Hyperlipidemia - Not currently on medication  Lab Results  Component Value Date   CHOL 225 (H) 11/23/2017   HDL 48.20 11/23/2017   LDLCALC 162 (H) 11/23/2017   TRIG 73.0 11/23/2017   CHOLHDL 5 11/23/2017     All immunizations and health maintenance protocols were reviewed with the patient and needed orders were placed.  Appropriate screening laboratory values were ordered for the patient including screening of hyperlipidemia, renal function and hepatic function. If indicated by BPH, a PSA was ordered.  Medication reconciliation,  past medical history, social history, problem list and allergies were reviewed in detail with the patient  Goals were established with regard to weight loss, exercise, and  diet in compliance with medications  He is due for routine colon cancer screening   Review of Systems  Constitutional: Negative.   HENT: Negative.   Eyes: Negative.   Respiratory: Negative.   Cardiovascular: Negative.   Gastrointestinal: Negative.   Endocrine: Negative.   Genitourinary: Negative.   Musculoskeletal: Negative.   Skin: Negative.   Allergic/Immunologic: Negative.   Neurological: Negative.   Hematological: Negative.   Psychiatric/Behavioral: Negative.   All other systems reviewed and are  negative.    Past Medical History:  Diagnosis Date  . Chicken pox   . DVT (deep venous thrombosis) ([place]) 09/2017    Social History   Socioeconomic History  . Marital status: Married    Spouse name: Not on file  . Number of children: Not on file  . Years of education: Not on file  . Highest education level: Not on file  Occupational History  . Not on file  Tobacco Use  . Smoking status: Never Smoker  . Smokeless tobacco: Never Used  Vaping Use  . Vaping Use: Never used  Substance and Sexual Activity  . Alcohol use: No  . Drug use: No  . Sexual activity: Not on file  Other Topics Concern  . Not on file  Social History Narrative   Married for 76 years    One daughters ( 68 years old) Lives local          Likes to ride his motorcycle and weight training.    Social Determinants of Health   Financial Resource Strain:   . Difficulty of Paying Living Expenses: Not on file  Food Insecurity:   . Worried About Charity fundraiser in the Last Year: Not on file  . Ran Out of Food in the Last Year: Not on file  Transportation Needs:   . Lack of Transportation (Medical): Not on file  . Lack of Transportation (Non-Medical): Not on file  Physical Activity:   . Days of Exercise per Week: Not on  file  . Minutes of Exercise per Session: Not on file  Stress:   . Feeling of Stress : Not on file  Social Connections:   . Frequency of Communication with Friends and Family: Not on file  . Frequency of Social Gatherings with Friends and Family: Not on file  . Attends Religious Services: Not on file  . Active Member of Clubs or Organizations: Not on file  . Attends Archivist Meetings: Not on file  . Marital Status: Not on file  Intimate Partner Violence:   . Fear of Current or Ex-Partner: Not on file  . Emotionally Abused: Not on file  . Physically Abused: Not on file  . Sexually Abused: Not on file    Past Surgical History:  Procedure Laterality Date  .  APPENDECTOMY    . CIRCUMCISION     newborn and at age 64    Family History  Problem Relation Age of Onset  . Stroke Mother   . Hypertension Mother   . Allergies Daughter     No Known Allergies  Current Outpatient Medications on File Prior to Visit  Medication Sig Dispense Refill  . ELIQUIS 5 MG TABS tablet TAKE 1 TABLET BY MOUTH TWICE A DAY 60 tablet 11  . Multiple Vitamins-Minerals (MULTIVITAMIN WITH MINERALS) tablet Take 1 tablet by mouth daily.    . predniSONE (DELTASONE) 20 MG tablet Take 1 tablet (20 mg total) by mouth daily with breakfast. 5 tablet 0   No current facility-administered medications on file prior to visit.    There were no vitals taken for this visit.      Objective:   Physical Exam Vitals and nursing note reviewed.  Constitutional:      General: He is not in acute distress.    Appearance: Normal appearance. He is well-developed and normal weight.  HENT:     Head: Normocephalic and atraumatic.     Right Ear: Tympanic membrane, ear canal and external ear normal. There is no impacted cerumen.     Left Ear: Tympanic membrane, ear canal and external ear normal. There is no impacted cerumen.     Nose: Nose normal. No congestion or rhinorrhea.     Mouth/Throat:     Mouth: Mucous membranes are moist.     Pharynx: Oropharynx is clear. No oropharyngeal exudate or posterior oropharyngeal erythema.  Eyes:     General:        Right eye: No discharge.        Left eye: No discharge.     Extraocular Movements: Extraocular movements intact.     Conjunctiva/sclera: Conjunctivae normal.     Pupils: Pupils are equal, round, and reactive to light.  Neck:     Vascular: No carotid bruit.     Trachea: No tracheal deviation.  Cardiovascular:     Rate and Rhythm: Normal rate and regular rhythm.     Pulses: Normal pulses.     Heart sounds: Normal heart sounds. No murmur heard.  No friction rub. No gallop.   Pulmonary:     Effort: Pulmonary effort is normal. No  respiratory distress.     Breath sounds: Normal breath sounds. No stridor. No wheezing, rhonchi or rales.  Chest:     Chest wall: No tenderness.  Abdominal:     General: Bowel sounds are normal. There is no distension.     Palpations: Abdomen is soft. There is no mass.     Tenderness: There is no abdominal tenderness. There is  no right CVA tenderness, left CVA tenderness, guarding or rebound.     Hernia: No hernia is present.  Musculoskeletal:        General: No swelling, tenderness, deformity or signs of injury. Normal range of motion.     Right lower leg: No edema.     Left lower leg: No edema.  Lymphadenopathy:     Cervical: No cervical adenopathy.  Skin:    General: Skin is warm and dry.     Capillary Refill: Capillary refill takes less than 2 seconds.     Coloration: Skin is not jaundiced or pale.     Findings: No bruising, erythema, lesion or rash.  Neurological:     General: No focal deficit present.     Mental Status: He is alert and oriented to person, place, and time.     Cranial Nerves: No cranial nerve deficit.     Sensory: No sensory deficit.     Motor: No weakness.     Coordination: Coordination normal.     Gait: Gait normal.     Deep Tendon Reflexes: Reflexes normal.  Psychiatric:        Mood and Affect: Mood normal.        Behavior: Behavior normal.        Thought Content: Thought content normal.        Judgment: Judgment normal.       Assessment & Plan:

## 2020-01-22 ENCOUNTER — Telehealth: Payer: Self-pay | Admitting: *Deleted

## 2020-01-22 NOTE — Telephone Encounter (Signed)
Received call from pt stating he received a letter in the mail from [facility] stating starting 03/13/20 his insurance will no longer cover Eliquis and will need to be switched to Xarelto or Warfarin.  Pt states he is currently enrolled in the Eliquis co pay assistance program and only pays $10 a month.  Pt states he does not believe this will change once January comes but will update our office if it does.

## 2020-04-01 ENCOUNTER — Other Ambulatory Visit: Payer: Self-pay | Admitting: Hematology and Oncology

## 2020-04-01 ENCOUNTER — Encounter: Payer: Self-pay | Admitting: *Deleted

## 2020-04-01 MED ORDER — APIXABAN 5 MG PO TABS: 5.0000 mg | ORAL_TABLET | Freq: Two times a day (BID) | ORAL | 11 refills | Status: DC

## 2020-04-01 MED FILL — ELIQUIS 5 MG TABLET: 5 | 30 days supply | Qty: 60 | Fill #0

## 2020-04-01 NOTE — Progress Notes (Signed)
Received call from pt stating [facility] is unable to fill Eliquis prescription due to insurance no longer covering Eliquis despite having a savings card on file. Per MD request, prescription for Eliquis 5 mg tablet be sent to [facility] out patient pharmacy for pt assistance.  Prescription successfully sent with savings card information.  Informed by the pharmacy that pt co pay would be $10.  Pt notified and verbalized understanding.

## 2020-04-12 ENCOUNTER — Telehealth: Payer: Self-pay | Admitting: *Deleted

## 2020-04-12 NOTE — Telephone Encounter (Signed)
Eliquis prior authorization denied per [facility] [employer].   Reason: "Drug not covered/Plan exclusion - prescription plan does not cover the requested medication."  Today's collaborative notified of denial.  Provider may appeal.  [place] assisted with recent refill along with patient's co-pay coupon may need to assist with future Eliquis orders if possible.

## 2020-04-14 NOTE — Telephone Encounter (Signed)
Per collaborative nurse patient approves continuing use of [place] with [employer] $10.00 co-pay card for Eliquis.  No further prior authorization activity by this nurse.

## 2020-05-04 MED FILL — ELIQUIS 5 MG TABLET: 5 | 30 days supply | Qty: 60 | Fill #1

## 2020-06-09 MED FILL — ELIQUIS 5 MG TABLET: 5 | 30 days supply | Qty: 60 | Fill #2

## 2020-06-23 ENCOUNTER — Other Ambulatory Visit (HOSPITAL_COMMUNITY): Payer: Self-pay

## 2020-06-23 ENCOUNTER — Telehealth: Payer: Self-pay | Admitting: *Deleted

## 2020-06-23 MED FILL — Apixaban Tab 5 MG: ORAL | 30 days supply | Qty: 60 | Fill #0 | Status: CN

## 2020-06-23 NOTE — Telephone Encounter (Signed)
Eliquis Prior Auth. sent 06/21/2020 as requested with "Prior auth requirement" received through CoverMyMeds assistance KEY: [id number] denied per web portal.  "Your PA request has been closed. Duplicate request.  ELIQUIS 5MG  TAB #60/30 days is Non-Formulary Drug, is not covered, use warfarin, XARELTO OR Emergency Fill."     Connected with [place] with above "Response Message from Plan" inquiring impact on assistance provided since January 2022 on future orders and possible actions to continue Eliquis,  This Pharmacist "found 'Eliquis Co-Pay card' in old system.  Card did not carry-over to current system.  Denial does not effect use of co-pay card.  Patient may continue Eliquis".

## 2020-07-02 ENCOUNTER — Other Ambulatory Visit (HOSPITAL_COMMUNITY): Payer: Self-pay

## 2020-07-16 ENCOUNTER — Other Ambulatory Visit (HOSPITAL_COMMUNITY): Payer: Self-pay

## 2020-07-16 MED FILL — Apixaban Tab 5 MG: ORAL | 30 days supply | Qty: 60 | Fill #0 | Status: AC

## 2020-08-24 ENCOUNTER — Other Ambulatory Visit (HOSPITAL_COMMUNITY): Payer: Self-pay

## 2020-08-24 MED FILL — Apixaban Tab 5 MG: ORAL | 30 days supply | Qty: 60 | Fill #1 | Status: AC

## 2020-08-26 ENCOUNTER — Other Ambulatory Visit (HOSPITAL_COMMUNITY): Payer: Self-pay

## 2020-08-27 ENCOUNTER — Other Ambulatory Visit (HOSPITAL_COMMUNITY): Payer: Self-pay

## 2020-09-04 ENCOUNTER — Emergency Department (HOSPITAL_COMMUNITY)
Admission: EM | Admit: 2020-09-04 | Discharge: 2020-09-05 | Disposition: A | Discharge status: Home / Self Care | Payer: BC Managed Care – PPO | Attending: Emergency Medicine | Admitting: Emergency Medicine

## 2020-09-04 ENCOUNTER — Encounter (HOSPITAL_COMMUNITY): Payer: Self-pay

## 2020-09-04 ENCOUNTER — Other Ambulatory Visit: Payer: Self-pay

## 2020-09-04 DIAGNOSIS — Z7901 Long term (current) use of anticoagulants: Secondary | ICD-10-CM | POA: Diagnosis not present

## 2020-09-04 DIAGNOSIS — T7840XA Allergy, unspecified, initial encounter: Secondary | ICD-10-CM

## 2020-09-04 DIAGNOSIS — Z91030 Bee allergy status: Secondary | ICD-10-CM | POA: Insufficient documentation

## 2020-09-04 DIAGNOSIS — T63441A Toxic effect of venom of bees, accidental (unintentional), initial encounter: Secondary | ICD-10-CM | POA: Diagnosis not present

## 2020-09-04 MED ORDER — SODIUM CHLORIDE 0.9 % IV BOLUS
1000.0000 mL | Freq: Once | INTRAVENOUS | Status: AC
  Administered 2020-09-04: 1000 mL via INTRAVENOUS

## 2020-09-04 MED ORDER — METHYLPREDNISOLONE SODIUM SUCC 125 MG IJ SOLR
125.0000 mg | Freq: Once | INTRAMUSCULAR | Status: AC
  Administered 2020-09-04: 125 mg via INTRAVENOUS
  Filled 2020-09-04: qty 2

## 2020-09-04 MED ORDER — EPINEPHRINE 0.3 MG/0.3ML IJ SOAJ
0.3000 mg | Freq: Once | INTRAMUSCULAR | Status: AC
  Administered 2020-09-04: 0.3 mg via INTRAMUSCULAR
  Filled 2020-09-04: qty 0.3

## 2020-09-04 MED ORDER — FAMOTIDINE IN NACL 20-0.9 MG/50ML-% IV SOLN
20.0000 mg | Freq: Once | INTRAVENOUS | Status: AC
  Administered 2020-09-04: 20 mg via INTRAVENOUS
  Filled 2020-09-04: qty 50

## 2020-09-04 MED ORDER — DIPHENHYDRAMINE HCL 50 MG/ML IJ SOLN
25.0000 mg | Freq: Once | INTRAMUSCULAR | Status: AC
  Administered 2020-09-04: 25 mg via INTRAVENOUS
  Filled 2020-09-04: qty 1

## 2020-09-04 NOTE — ED Triage Notes (Signed)
Patient arrived stating roughly 30 minutes ago he was stung by a bee and he has a known allergy. Reports feeling nausea. States he may feel some throat swelling now that he has arrived.

## 2020-09-04 NOTE — ED Provider Notes (Signed)
[facility] DEPT Provider Note   CSN: [id number] Arrival date & time: 09/04/20  2122     History Chief Complaint  Patient presents with   Allergic Reaction    Gregory Montgomery. is a 55 y.o. male.  Patient states that he was stung by several bees on his arms and his legs.  He complains of swelling to his arms and legs.  His wife states that he does have some facial swelling today  The history is provided by the patient and medical records. No language interpreter was used.  Allergic Reaction Presenting symptoms: no difficulty breathing, no difficulty swallowing and no rash   Severity:  Moderate Prior allergic episodes:  Insect allergies Context: not animal exposure   Relieved by:  Nothing Worsened by:  Nothing Ineffective treatments:  None tried     Past Medical History:  Diagnosis Date   Chicken pox    DVT (deep venous thrombosis) ([place]) 09/2017    Patient Active Problem List   Diagnosis Date Noted   Acute deep vein thrombosis (DVT) of tibial vein of left lower extremity ([place]) 10/18/2017    Past Surgical History:  Procedure Laterality Date   APPENDECTOMY     CIRCUMCISION     newborn and at age 79       Family History  Problem Relation Age of Onset   Stroke Mother    Hypertension Mother    Allergies Daughter     Social History   Tobacco Use   Smoking status: Never   Smokeless tobacco: Never  Vaping Use   Vaping Use: Never used  Substance Use Topics   Alcohol use: No   Drug use: No    Home Medications Prior to Admission medications   Medication Sig Start Date End Date Taking? Authorizing Provider  apixaban (ELIQUIS) 5 MG TABS tablet TAKE 1 TABLET BY MOUTH 2 TIMES DAILY 04/01/20 04/01/21  Nicholas Lose, MD  Multiple Vitamins-Minerals (MULTIVITAMIN WITH MINERALS) tablet Take 1 tablet by mouth daily.    [provider]  predniSONE (DELTASONE) 20 MG tablet Take 1 tablet (20 mg total) by mouth daily with  breakfast. 11/05/19   Dorothyann Peng, NP    Allergies    Patient has no known allergies.  Review of Systems   Review of Systems  Constitutional:  Negative for appetite change and fatigue.  HENT:  Negative for congestion, ear discharge, sinus pressure and trouble swallowing.   Eyes:  Negative for discharge.  Respiratory:  Negative for cough.   Cardiovascular:  Negative for chest pain.  Gastrointestinal:  Negative for abdominal pain and diarrhea.  Genitourinary:  Negative for frequency and hematuria.  Musculoskeletal:  Negative for back pain.       Swelling and itching to both upper arm  Skin:  Negative for rash.  Neurological:  Negative for seizures and headaches.  Psychiatric/Behavioral:  Negative for hallucinations.    Physical Exam Updated Vital Signs BP (!) 147/90   Pulse 82   Temp 98.4 F (36.9 C) (Oral)   Resp 20   Ht 6\' 2"  (1.88 m)   Wt 110.2 kg   SpO2 96%   BMI 31.20 kg/m   Physical Exam Vitals and nursing note reviewed.  Constitutional:      Appearance: He is well-developed.  HENT:     Head: Normocephalic.     Comments: Patient with mild swelling to his lips and his forehead Eyes:     General: No scleral icterus.  Conjunctiva/sclera: Conjunctivae normal.  Neck:     Thyroid: No thyromegaly.  Cardiovascular:     Rate and Rhythm: Normal rate and regular rhythm.     Heart sounds: No murmur heard.   No friction rub. No gallop.  Pulmonary:     Breath sounds: No stridor. No wheezing or rales.  Chest:     Chest wall: No tenderness.  Abdominal:     General: There is no distension.     Tenderness: There is no abdominal tenderness. There is no rebound.  Musculoskeletal:        General: Normal range of motion.     Cervical back: Neck supple.     Comments: Swelling to both upper arms and right leg  Lymphadenopathy:     Cervical: No cervical adenopathy.  Skin:    Findings: No erythema or rash.  Neurological:     Mental Status: He is alert and oriented to  person, place, and time.     Motor: No abnormal muscle tone.     Coordination: Coordination normal.  Psychiatric:        Behavior: Behavior normal.    ED Results / Procedures / Treatments   Labs (all labs ordered are listed, but only abnormal results are displayed) Labs Reviewed - No data to display  EKG None  Radiology No results found.  Procedures Procedures   Medications Ordered in ED Medications  EPINEPHrine (EPI-PEN) injection 0.3 mg (has no administration in time range)  diphenhydrAMINE (BENADRYL) injection 25 mg (has no administration in time range)  sodium chloride 0.9 % bolus 1,000 mL (1,000 mLs Intravenous New Bag/Given 09/04/20 2151)  famotidine (PEPCID) IVPB 20 mg premix (20 mg Intravenous New Bag/Given 09/04/20 2151)  diphenhydrAMINE (BENADRYL) injection 25 mg (25 mg Intravenous Given 09/04/20 2150)  methylPREDNISolone sodium succinate (SOLU-MEDROL) 125 mg/2 mL injection 125 mg (125 mg Intravenous Given 09/04/20 2151)  sodium chloride 0.9 % bolus 1,000 mL (1,000 mLs Intravenous New Bag/Given 09/04/20 2151)    ED Course  I have reviewed the triage vital signs and the nursing notes.  Pertinent labs & imaging results that were available during my care of the patient were reviewed by me and considered in my medical decision making (see chart for details). Patient with allergic reaction with mild systemic changes causing facial swelling.  Initially he was given Pepcid Benadryl and Solu-Medrol.  He was slightly improved but still has facial swelling so he was then given more Benadryl and epi   MDM Rules/Calculators/A&P                          Allergic reaction to bee sting disposition to be done by my colleague Final Clinical Impression(s) / ED Diagnoses Final diagnoses:  None    Rx / DC Orders ED Discharge Orders     None        Milton Ferguson, MD 09/06/20 1021

## 2020-09-05 MED ORDER — EPINEPHRINE 0.3 MG/0.3ML IJ SOAJ: 0.3000 mg | INTRAMUSCULAR | 0 refills | Status: AC | PRN

## 2020-09-05 MED ORDER — METHYLPREDNISOLONE 4 MG PO TBPK: ORAL_TABLET | ORAL | 0 refills | Status: DC

## 2020-09-05 NOTE — ED Provider Notes (Signed)
Patient was signed out to me by Dr. Roderic Palau.  Patient with allergic reaction secondary to multiple bee stings.  Patient with swelling at the sites but also swelling of the face.  He was administered epi, Benadryl, Pepcid.  Patient has been monitored for 3 hours post epi injection and his symptoms have resolved.  Will discharge with continued treatment, given return precautions.   Orpah Greek, MD 09/05/20 [id number]

## 2020-09-05 NOTE — Discharge Instructions (Addendum)
Take 1 Benadryl tablet every 4-6 hours for the next 1 to 2 days as needed for any itching or swelling.  If you have any difficulty breathing, tongue swelling, difficulty swallowing call 911.

## 2020-09-16 ENCOUNTER — Encounter: Payer: Self-pay | Admitting: Family Medicine

## 2020-09-16 ENCOUNTER — Other Ambulatory Visit: Payer: Self-pay

## 2020-09-16 ENCOUNTER — Ambulatory Visit (INDEPENDENT_AMBULATORY_CARE_PROVIDER_SITE_OTHER): Payer: BC Managed Care – PPO | Admitting: Family Medicine

## 2020-09-16 VITALS — BP 132/84 | HR 89 | Temp 98.1°F | Wt 240.8 lb

## 2020-09-16 DIAGNOSIS — L309 Dermatitis, unspecified: Secondary | ICD-10-CM

## 2020-09-16 MED ORDER — PREDNISONE 10 MG PO TABS: ORAL_TABLET | ORAL | 0 refills | Status: DC

## 2020-09-16 MED ORDER — CETIRIZINE HCL 10 MG PO TABS: 10.0000 mg | ORAL_TABLET | Freq: Every day | ORAL | 0 refills | Status: DC

## 2020-09-16 NOTE — Progress Notes (Signed)
Subjective:    Patient ID: Gregory Bass., male    DOB: 23-Apr-1965, 55 y.o.   MRN: [id number]  Chief Complaint  Patient presents with   Rash    HPI Patient was seen today for going concern.  Patient endorses rash on forearms after trimming brushes in the backyard 2 wks ago.  Endorses working in Biomedical scientist.  Tried cortisone cream at the suggestion of pharmacy and patches for hives.  Cortisone helps briefly.  Pt denies fever chills, nausea, vomiting.  Of note, pt recently on medrol dose pack for bee sting.  Seen in ED 09/05/20.    Past Medical History:  Diagnosis Date   Chicken pox    DVT (deep venous thrombosis) ([facility]) 09/2017    No Known Allergies  ROS General: Denies fever, chills, night sweats, changes in weight, changes in appetite HEENT: Denies headaches, ear pain, changes in vision, rhinorrhea, sore throat CV: Denies CP, palpitations, SOB, orthopnea Pulm: Denies SOB, cough, wheezing GI: Denies abdominal pain, nausea, vomiting, diarrhea, constipation GU: Denies dysuria, hematuria, frequency, vaginal discharge Msk: Denies muscle cramps, joint pains Neuro: Denies weakness, numbness, tingling Skin: Denies bruising +pruritic rash on forearms. Psych: Denies depression, anxiety, hallucinations     Objective:    Blood pressure 132/84, pulse 89, temperature 98.1 F (36.7 C), temperature source Oral, weight 240 lb 12.8 oz (109.2 kg), SpO2 96 %.  Gen. Pleasant, well-nourished, in no distress, normal affect   HEENT: [place]/AT, face symmetric, conjunctiva clear, no scleral icterus, PERRLA, EOMI, nares patent without drainage Lungs: no accessory muscle use Cardiovascular: RRR, no peripheral edema Musculoskeletal: No deformities, no cyanosis or clubbing, normal tone Neuro:  A&Ox3, CN II-XII intact, normal gait Skin:  Warm, dry, intact.  Fine flesh colored slighly raised papules some with erythema on b/l forearms, excoriations noted.   Wt Readings from Last 3 Encounters:   09/16/20 240 lb 12.8 oz (109.2 kg)  09/04/20 243 lb (110.2 kg)  11/05/19 243 lb (110.2 kg)    Lab Results  Component Value Date   WBC 4.0 11/23/2017   HGB 16.0 11/23/2017   HCT 47.7 11/23/2017   PLT 175.0 11/23/2017   GLUCOSE 87 11/23/2017   CHOL 225 (H) 11/23/2017   TRIG 73.0 11/23/2017   HDL 48.20 11/23/2017   LDLCALC 162 (H) 11/23/2017   ALT 18 11/23/2017   AST 14 11/23/2017   NA 139 11/23/2017   K 4.3 11/23/2017   CL 103 11/23/2017   CREATININE 1.17 11/23/2017   BUN 12 11/23/2017   CO2 26 11/23/2017   TSH 3.25 11/23/2017   PSA 0.73 11/23/2017    Assessment/Plan:  Dermatitis  -Discussed possible causes including contact dermatitis versus miliaria 2/2 sun exposure -Avoidance advised -Discussed supportive care of symptoms including emollient creams, gentle moisturizers, topical cortisone. -Advised to wear protective clothing and sunscreen when in the sun/doing yard work -Given strict precautions - Plan: predniSONE (DELTASONE) 10 MG tablet, cetirizine (ZYRTEC) 10 MG tablet  F/u as needed with PCP  Grier Mitts, MD

## 2020-09-28 ENCOUNTER — Other Ambulatory Visit (HOSPITAL_COMMUNITY): Payer: Self-pay

## 2020-09-28 MED FILL — Apixaban Tab 5 MG: ORAL | 30 days supply | Qty: 60 | Fill #2 | Status: AC

## 2020-10-19 ENCOUNTER — Ambulatory Visit (INDEPENDENT_AMBULATORY_CARE_PROVIDER_SITE_OTHER): Payer: BC Managed Care – PPO | Admitting: Family Medicine

## 2020-10-19 ENCOUNTER — Other Ambulatory Visit: Payer: Self-pay

## 2020-10-19 VITALS — BP 130/80 | HR 80 | Temp 97.7°F | Wt 245.7 lb

## 2020-10-19 DIAGNOSIS — T63441A Toxic effect of venom of bees, accidental (unintentional), initial encounter: Secondary | ICD-10-CM

## 2020-10-19 NOTE — Progress Notes (Signed)
Established Patient Office Visit  Subjective:  Patient ID: Gregory Slovick., male    DOB: 09/13/1965  Age: 55 y.o. MRN: [id number]  CC:  Chief Complaint  Patient presents with   Insect Bite    Saturday, yellow jacket sings on the R foot/ leg, swollen, somepain    HPI Gregory Montgomery. presents for evaluation of bee stings right ankle region which occurred last Saturday.  He has Education administrator.  He was using a push mower and apparently ran over a nest area.  He had a couple stings 1 medial right ankle and one laterally.  He did apply some ice later.  He has been taking some Benadryl for the swelling.  Has some ongoing swelling today.  Denied any generalized rash.  No angioedema type symptoms.  No fever.  Past Medical History:  Diagnosis Date   Chicken pox    DVT (deep venous thrombosis) ([place]) 09/2017    Past Surgical History:  Procedure Laterality Date   APPENDECTOMY     CIRCUMCISION     newborn and at age 30    Family History  Problem Relation Age of Onset   Stroke Mother    Hypertension Mother    Allergies Daughter     Social History   Socioeconomic History   Marital status: Married    Spouse name: Not on file   Number of children: Not on file   Years of education: Not on file   Highest education level: Not on file  Occupational History   Not on file  Tobacco Use   Smoking status: Never   Smokeless tobacco: Never  Vaping Use   Vaping Use: Never used  Substance and Sexual Activity   Alcohol use: No   Drug use: No   Sexual activity: Not on file  Other Topics Concern   Not on file  Social History Narrative   Married for 48 years    One daughters ( 2 years old) Lives local          Likes to ride his motorcycle and [employer] training.    Social Determinants of Health   Financial Resource Strain: Not on file  Food Insecurity: Not on file  Transportation Needs: Not on file  Physical Activity: Not on file  Stress: Not on file  Social  Connections: Not on file  Intimate Partner Violence: Not on file    Outpatient Medications Prior to Visit  Medication Sig Dispense Refill   apixaban (ELIQUIS) 5 MG TABS tablet TAKE 1 TABLET BY MOUTH 2 TIMES DAILY 60 tablet 11   cetirizine (ZYRTEC) 10 MG tablet Take 1 tablet (10 mg total) by mouth daily. 30 tablet 0   EPINEPHrine 0.3 mg/0.3 mL IJ SOAJ injection Inject 0.3 mg into the muscle as needed for anaphylaxis. 1 each 0   Multiple Vitamins-Minerals (MULTIVITAMIN WITH MINERALS) tablet Take 1 tablet by mouth daily.     predniSONE (DELTASONE) 10 MG tablet Take 4 tabs every morning for 3 days, 3 tabs for 2 days, 2 tabs for 2 days, 1 tab for 1 day. 23 tablet 0   No facility-administered medications prior to visit.    No Known Allergies  ROS Review of Systems  Constitutional:  Negative for chills and fever.  Respiratory:  Negative for cough and shortness of breath.   Cardiovascular:  Negative for chest pain.  Neurological:  Negative for dizziness.     Objective:    Physical Exam Vitals reviewed.  Constitutional:  Appearance: Normal appearance.  Cardiovascular:     Rate and Rhythm: Normal rate and regular rhythm.  Pulmonary:     Effort: Pulmonary effort is normal.     Breath sounds: Normal breath sounds.  Skin:    Comments: Right ankle region and lower leg region reveals some mild swelling medially and laterally.  Minimal warmth.  Minimal erythema.  Neurological:     Mental Status: He is alert.    BP 130/80 (BP Location: Left Arm, Patient Position: Sitting, Cuff Size: Normal)   Pulse 80   Temp 97.7 F (36.5 C) (Oral)   Wt 245 lb 11.2 oz (111.4 kg)   SpO2 97%   BMI 31.55 kg/m  Wt Readings from Last 3 Encounters:  10/19/20 245 lb 11.2 oz (111.4 kg)  09/16/20 240 lb 12.8 oz (109.2 kg)  09/04/20 243 lb (110.2 kg)     Health Maintenance Due  Topic Date Due   COVID-19 Vaccine (1) Never done   HIV Screening  Never done   Hepatitis C Screening  Never done    TETANUS/TDAP  Never done   COLONOSCOPY (Pts 45-59yr Insurance coverage will need to be confirmed)  Never done   Zoster Vaccines- Shingrix (1 of 2) Never done   INFLUENZA VACCINE  10/11/2020    There are no preventive care reminders to display for this patient.  Lab Results  Component Value Date   TSH 3.25 11/23/2017   Lab Results  Component Value Date   WBC 4.0 11/23/2017   HGB 16.0 11/23/2017   HCT 47.7 11/23/2017   MCV 84.1 11/23/2017   PLT 175.0 11/23/2017   Lab Results  Component Value Date   NA 139 11/23/2017   K 4.3 11/23/2017   CO2 26 11/23/2017   GLUCOSE 87 11/23/2017   BUN 12 11/23/2017   CREATININE 1.17 11/23/2017   BILITOT 1.4 (H) 11/23/2017   ALKPHOS 40 11/23/2017   AST 14 11/23/2017   ALT 18 11/23/2017   PROT 6.8 11/23/2017   ALBUMIN 4.1 11/23/2017   CALCIUM 9.0 11/23/2017   GFR 84.76 11/23/2017   Lab Results  Component Value Date   CHOL 225 (H) 11/23/2017   Lab Results  Component Value Date   HDL 48.20 11/23/2017   Lab Results  Component Value Date   LDLCALC 162 (H) 11/23/2017   Lab Results  Component Value Date   TRIG 73.0 11/23/2017   Lab Results  Component Value Date   CHOLHDL 5 11/23/2017   No results found for: HGBA1C    Assessment & Plan:   Problem List Items Addressed This Visit   None Visit Diagnoses     Bee sting, accidental or unintentional, initial encounter    -  Primary     Bee sting right lower leg and ankle region.  He actually had a couple stings and has some local allergic reaction.  No evidence for anaphylaxis or more generalized allergy.  -Continue icing as needed -Suggested over-the-counter antihistamine such as Xyzal and may continue Benadryl at night.  This should start to go down over the next couple days.  No orders of the defined types were placed in this encounter.   Follow-up: No follow-ups on file.    BCarolann Littler MD

## 2020-10-19 NOTE — Patient Instructions (Signed)
Consider over the counter Xyzal  5 mg daily as needed for swelling and itching.

## 2020-11-01 ENCOUNTER — Other Ambulatory Visit (HOSPITAL_COMMUNITY): Payer: Self-pay

## 2020-11-01 MED FILL — Apixaban Tab 5 MG: ORAL | 30 days supply | Qty: 60 | Fill #3 | Status: AC

## 2020-12-08 ENCOUNTER — Other Ambulatory Visit: Payer: Self-pay

## 2020-12-10 ENCOUNTER — Other Ambulatory Visit (HOSPITAL_COMMUNITY): Payer: Self-pay

## 2020-12-10 MED FILL — Apixaban Tab 5 MG: ORAL | 30 days supply | Qty: 60 | Fill #4 | Status: CN

## 2020-12-10 MED FILL — Apixaban Tab 5 MG: ORAL | 30 days supply | Qty: 60 | Fill #4 | Status: AC

## 2021-01-20 ENCOUNTER — Other Ambulatory Visit (HOSPITAL_COMMUNITY): Payer: Self-pay

## 2021-01-20 MED FILL — Apixaban Tab 5 MG: ORAL | 30 days supply | Qty: 60 | Fill #5 | Status: AC

## 2021-03-02 ENCOUNTER — Other Ambulatory Visit (HOSPITAL_COMMUNITY): Payer: Self-pay

## 2021-03-02 MED FILL — Apixaban Tab 5 MG: ORAL | 30 days supply | Qty: 60 | Fill #6 | Status: AC

## 2021-04-08 ENCOUNTER — Other Ambulatory Visit: Payer: Self-pay | Admitting: Hematology and Oncology

## 2021-04-08 ENCOUNTER — Ambulatory Visit (INDEPENDENT_AMBULATORY_CARE_PROVIDER_SITE_OTHER): Payer: BC Managed Care – PPO | Admitting: Adult Health

## 2021-04-08 ENCOUNTER — Encounter: Payer: Self-pay | Admitting: Adult Health

## 2021-04-08 ENCOUNTER — Other Ambulatory Visit (HOSPITAL_COMMUNITY): Payer: Self-pay

## 2021-04-08 VITALS — BP 120/82 | HR 79 | Temp 97.7°F | Ht 72.0 in | Wt 246.0 lb

## 2021-04-08 DIAGNOSIS — Z23 Encounter for immunization: Secondary | ICD-10-CM

## 2021-04-08 DIAGNOSIS — Z0000 Encounter for general adult medical examination without abnormal findings: Secondary | ICD-10-CM | POA: Diagnosis not present

## 2021-04-08 DIAGNOSIS — I82442 Acute embolism and thrombosis of left tibial vein: Secondary | ICD-10-CM

## 2021-04-08 DIAGNOSIS — Z125 Encounter for screening for malignant neoplasm of prostate: Secondary | ICD-10-CM

## 2021-04-08 DIAGNOSIS — Z1211 Encounter for screening for malignant neoplasm of colon: Secondary | ICD-10-CM

## 2021-04-08 DIAGNOSIS — Z1159 Encounter for screening for other viral diseases: Secondary | ICD-10-CM | POA: Diagnosis not present

## 2021-04-08 DIAGNOSIS — Z114 Encounter for screening for human immunodeficiency virus [HIV]: Secondary | ICD-10-CM

## 2021-04-08 MED ORDER — APIXABAN 5 MG PO TABS
ORAL_TABLET | Freq: Two times a day (BID) | ORAL | 11 refills | Status: DC
  Filled 2021-04-08: qty 60, 30d supply, fill #0
  Filled 2021-05-20: qty 60, 30d supply, fill #1
  Filled 2021-06-24: qty 60, 30d supply, fill #2
  Filled 2021-08-02: qty 60, 30d supply, fill #3
  Filled 2021-09-13: qty 60, 30d supply, fill #4
  Filled 2021-10-24: qty 60, 30d supply, fill #5
  Filled 2021-12-06: qty 60, 30d supply, fill #6
  Filled 2022-01-27: qty 60, 30d supply, fill #7
  Filled 2022-03-10: qty 60, 30d supply, fill #8

## 2021-04-08 NOTE — Progress Notes (Signed)
Subjective:    Patient ID: Gregory Montgomery., male    DOB: [birth year], 56 y.o.   MRN: [id number]  HPI  Patient presents for yearly preventative medicine examination. She is a pleasant 56 year old male who  has a past medical history of Chicken pox and DVT (deep venous thrombosis) ([place]) (09/2017).  H/o DVT of tibial vein of left lower extremity -in 2019.  This was unprovoked.  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 Denes bled  All immunizations and health maintenance protocols were reviewed with the patient and needed orders were placed.  Appropriate screening laboratory values were ordered for the patient including screening of hyperlipidemia, renal function and hepatic function. If indicated by BPH, a PSA was ordered.  Medication reconciliation,  past medical history, social history, problem list and allergies were reviewed in detail with the patient  Goals were established with regard to weight loss, exercise, and  diet in compliance with medications  Wt Readings from Last 3 Encounters:  04/08/21 246 lb (111.6 kg)  10/19/20 245 lb 11.2 oz (111.4 kg)  09/16/20 240 lb 12.8 oz (109.2 kg)   Review of Systems  Constitutional: Negative.   HENT: Negative.    Eyes: Negative.   Respiratory: Negative.    Cardiovascular: Negative.   Gastrointestinal: Negative.   Endocrine: Negative.   Genitourinary: Negative.   Musculoskeletal: Negative.   Skin: Negative.   Allergic/Immunologic: Negative.   Neurological: Negative.   Hematological: Negative.   Psychiatric/Behavioral: Negative.    All other systems reviewed and are negative.  Past Medical History:  Diagnosis Date   Chicken pox    DVT (deep venous thrombosis) ([place]) 09/2017    Social History   Socioeconomic History   Marital status: Married    Spouse name: Not on file   Number of children: Not on file   Years of education: Not on file   Highest education level: Not on  file  Occupational History   Not on file  Tobacco Use   Smoking status: Never   Smokeless tobacco: Never  Vaping Use   Vaping Use: Never used  Substance and Sexual Activity   Alcohol use: No   Drug use: No   Sexual activity: Not on file  Other Topics Concern   Not on file  Social History Narrative   Married for 46 years    One daughters ( 79 years old) Lives local          Likes to ride his motorcycle and [employer] training.    Social Determinants of Health   Financial Resource Strain: Not on file  Food Insecurity: Not on file  Transportation Needs: Not on file  Physical Activity: Not on file  Stress: Not on file  Social Connections: Not on file  Intimate Partner Violence: Not on file    Past Surgical History:  Procedure Laterality Date   APPENDECTOMY     CIRCUMCISION     newborn and at age 75    Family History  Problem Relation Age of Onset   Stroke Mother    Hypertension Mother    Allergies Daughter     Allergies  Allergen Reactions   Bee Venom Swelling    Facial, tongue and hand swelling  Yellow Jackets    Current Outpatient Medications on File Prior to Visit  Medication Sig Dispense Refill   EPINEPHrine 0.3 mg/0.3 mL IJ SOAJ injection Inject 0.3 mg into the muscle as needed for  anaphylaxis. 1 each 0   Multiple Vitamins-Minerals (MULTIVITAMIN WITH MINERALS) tablet Take 1 tablet by mouth daily.     No current facility-administered medications on file prior to visit.    BP 120/82    Pulse 79    Temp 97.7 F (36.5 C) (Oral)    Ht 6' (1.829 m)    Wt 246 lb (111.6 kg)    SpO2 97%    BMI 33.36 kg/m        Objective:   Physical Exam Vitals and nursing note reviewed.  Constitutional:      General: He is not in acute distress.    Appearance: Normal appearance. He is well-developed and overweight.  HENT:     Head: Normocephalic and atraumatic.     Right Ear: Tympanic membrane, ear canal and external ear normal. There is no impacted cerumen.     Left  Ear: Tympanic membrane, ear canal and external ear normal. There is no impacted cerumen.     Nose: Nose normal. No congestion or rhinorrhea.     Mouth/Throat:     Mouth: Mucous membranes are moist.     Pharynx: Oropharynx is clear. No oropharyngeal exudate or posterior oropharyngeal erythema.  Eyes:     General:        Right eye: No discharge.        Left eye: No discharge.     Extraocular Movements: Extraocular movements intact.     Conjunctiva/sclera: Conjunctivae normal.     Pupils: Pupils are equal, round, and reactive to light.  Neck:     Vascular: No carotid bruit.     Trachea: No tracheal deviation.  Cardiovascular:     Rate and Rhythm: Normal rate and regular rhythm.     Pulses: Normal pulses.     Heart sounds: Normal heart sounds. No murmur heard.   No friction rub. No gallop.  Pulmonary:     Effort: Pulmonary effort is normal. No respiratory distress.     Breath sounds: Normal breath sounds. No stridor. No wheezing, rhonchi or rales.  Chest:     Chest wall: No tenderness.  Abdominal:     General: Bowel sounds are normal. There is no distension.     Palpations: Abdomen is soft. There is no mass.     Tenderness: There is no abdominal tenderness. There is no right CVA tenderness, left CVA tenderness, guarding or rebound.     Hernia: No hernia is present.  Musculoskeletal:        General: No swelling, tenderness, deformity or signs of injury. Normal range of motion.     Right lower leg: No edema.     Left lower leg: No edema.  Lymphadenopathy:     Cervical: No cervical adenopathy.  Skin:    General: Skin is warm and dry.     Capillary Refill: Capillary refill takes less than 2 seconds.     Coloration: Skin is not jaundiced or pale.     Findings: No bruising, erythema, lesion or rash.  Neurological:     General: No focal deficit present.     Mental Status: He is alert and oriented to person, place, and time.     Cranial Nerves: No cranial nerve deficit.     Sensory:  No sensory deficit.     Motor: No weakness.     Coordination: Coordination normal.     Gait: Gait normal.     Deep Tendon Reflexes: Reflexes normal.  Psychiatric:  Mood and Affect: Mood normal.        Behavior: Behavior normal.        Thought Content: Thought content normal.        Judgment: Judgment normal.      Assessment & Plan:  1. Routine general medical examination at a health care facility - Continue with lifestyle modifications  - Follow up in one year or sooner if needed - CBC with Differential/Platelet; Future - Comprehensive metabolic panel; Future - Hemoglobin A1c; Future - Lipid panel; Future - TSH; Future - Hep C Antibody; Future - HIV Antibody (routine testing w rflx); Future  2. Acute deep vein thrombosis (DVT) of tibial vein of left lower extremity ([facility]) - Continue Eliquis as directed  3. Colon cancer screening  - Ambulatory referral to Gastroenterology  4. Need for hepatitis C screening test  - Hep C Antibody; Future  5. Encounter for screening for HIV  - HIV Antibody (routine testing w rflx); Future  6. Prostate cancer screening  - PSA; Future   7. Need for Tdap vaccination  - Tdap vaccine greater than or equal to 7yo IM  8. Need for shingles vaccine  - Varicella-zoster vaccine IM - Return in 3-6 months for second shingles vaccination   Dorothyann Peng, NP

## 2021-04-08 NOTE — Patient Instructions (Signed)
It was great seeing you today!  We will follow up with you regarding your blood work   I will see you back in one year or sooner if needed  Please follow up for a nursing visit in 3-6 months for your second shingles vaccination

## 2021-04-11 LAB — CBC WITH DIFFERENTIAL/PLATELET
Absolute Monocytes: 320 cells/uL (ref 200–950)
Basophils Absolute: 12 cells/uL (ref 0–200)
Basophils Relative: 0.3 %
Eosinophils Absolute: 180 cells/uL (ref 15–500)
Eosinophils Relative: 4.5 %
HCT: 48.2 % (ref 38.5–50.0)
Hemoglobin: 16 g/dL (ref 13.2–17.1)
Lymphs Abs: 1444 cells/uL (ref 850–3900)
MCH: 27.7 pg (ref 27.0–33.0)
MCHC: 33.2 g/dL (ref 32.0–36.0)
MCV: 83.5 fL (ref 80.0–100.0)
MPV: 12.5 fL (ref 7.5–12.5)
Monocytes Relative: 8 %
Neutro Abs: 2044 cells/uL (ref 1500–7800)
Neutrophils Relative %: 51.1 %
Platelets: 167 10*3/uL (ref 140–400)
RBC: 5.77 10*6/uL (ref 4.20–5.80)
RDW: 13.5 % (ref 11.0–15.0)
Total Lymphocyte: 36.1 %
WBC: 4 10*3/uL (ref 3.8–10.8)

## 2021-04-11 LAB — COMPREHENSIVE METABOLIC PANEL
AG Ratio: 1.4 (calc) (ref 1.0–2.5)
ALT: 15 U/L (ref 9–46)
AST: 16 U/L (ref 10–35)
Albumin: 4 g/dL (ref 3.6–5.1)
Alkaline phosphatase (APISO): 43 U/L (ref 35–144)
BUN: 15 mg/dL (ref 7–25)
CO2: 25 mmol/L (ref 20–32)
Calcium: 8.9 mg/dL (ref 8.6–10.3)
Chloride: 104 mmol/L (ref 98–110)
Creat: 1.11 mg/dL (ref 0.70–1.30)
Globulin: 2.9 g/dL (calc) (ref 1.9–3.7)
Glucose, Bld: 73 mg/dL (ref 65–99)
Potassium: 3.9 mmol/L (ref 3.5–5.3)
Sodium: 139 mmol/L (ref 135–146)
Total Bilirubin: 1.7 mg/dL — ABNORMAL HIGH (ref 0.2–1.2)
Total Protein: 6.9 g/dL (ref 6.1–8.1)

## 2021-04-11 LAB — HEMOGLOBIN A1C
Hgb A1c MFr Bld: 5.6 % of total Hgb (ref <5.7)
Mean Plasma Glucose: 114 mg/dL
eAG (mmol/L): 6.3 mmol/L

## 2021-04-11 LAB — TSH: TSH: 3.36 mIU/L (ref 0.40–4.50)

## 2021-04-11 LAB — LIPID PANEL
Cholesterol: 244 mg/dL — ABNORMAL HIGH (ref <200)
HDL: 53 mg/dL (ref >=40)
LDL Cholesterol (Calc): 175 mg/dL (calc) — ABNORMAL HIGH
Non-HDL Cholesterol (Calc): 191 mg/dL (calc) — ABNORMAL HIGH (ref <130)
Total CHOL/HDL Ratio: 4.6 (calc) (ref <5.0)
Triglycerides: 60 mg/dL (ref <150)

## 2021-04-11 LAB — HIV ANTIBODY (ROUTINE TESTING W REFLEX): HIV 1&2 Ab, 4th Generation: NONREACTIVE

## 2021-04-11 LAB — PSA: PSA: 1 ng/mL (ref <=4.00)

## 2021-04-11 LAB — HEPATITIS C ANTIBODY
Hepatitis C Ab: NONREACTIVE
SIGNAL TO CUT-OFF: 0.02 (ref <1.00)

## 2021-04-12 ENCOUNTER — Other Ambulatory Visit: Payer: Self-pay

## 2021-04-12 MED ORDER — ROSUVASTATIN CALCIUM 10 MG PO TABS: 10.0000 mg | ORAL_TABLET | Freq: Every day | ORAL | 3 refills | Status: DC

## 2021-05-20 ENCOUNTER — Other Ambulatory Visit (HOSPITAL_COMMUNITY): Payer: Self-pay

## 2021-06-21 ENCOUNTER — Telehealth: Payer: Self-pay

## 2021-06-21 ENCOUNTER — Ambulatory Visit (INDEPENDENT_AMBULATORY_CARE_PROVIDER_SITE_OTHER): Payer: BC Managed Care – PPO | Admitting: Internal Medicine

## 2021-06-21 ENCOUNTER — Encounter: Payer: Self-pay | Admitting: Internal Medicine

## 2021-06-21 VITALS — BP 112/66 | HR 81 | Ht 74.0 in | Wt 244.8 lb

## 2021-06-21 DIAGNOSIS — Z1211 Encounter for screening for malignant neoplasm of colon: Secondary | ICD-10-CM | POA: Diagnosis not present

## 2021-06-21 NOTE — Progress Notes (Signed)
? ?  Chief Complaint: Colon cancer screening ? ?HPI : 56 year old male with history of DVT on Eliquis presents to discuss colon cancer screening. ? ?Denies hematochezia, melena, weight loss, ab pain, constipation, weight loss. Denies N&V, dysphagia. Denies fam hx of colon cancer. He is on Eliquis therapy. He follows with Dr. Sonny Dandy.  Appendectomy in the past. ? ?Past Medical History:  ?Diagnosis Date  ? Chicken pox   ? DVT (deep venous thrombosis) ([place]) 09/2017  ? Hyperlipidemia   ? ? ? ?Past Surgical History:  ?Procedure Laterality Date  ? APPENDECTOMY    ? CIRCUMCISION    ? newborn and at age 27  ? ?Family History  ?Problem Relation Age of Onset  ? Stroke Mother   ? Hypertension Mother   ? Allergies Daughter   ? Colon cancer Neg Hx   ? Stomach cancer Neg Hx   ? Esophageal cancer Neg Hx   ? ?Social History  ? ?Tobacco Use  ? Smoking status: Never  ? Smokeless tobacco: Never  ?Vaping Use  ? Vaping Use: Never used  ?Substance Use Topics  ? Alcohol use: No  ? Drug use: No  ? ?Current Outpatient Medications  ?Medication Sig Dispense Refill  ? apixaban (ELIQUIS) 5 MG TABS tablet TAKE 1 TABLET BY MOUTH 2 TIMES DAILY 60 tablet 11  ? EPINEPHrine 0.3 mg/0.3 mL IJ SOAJ injection Inject 0.3 mg into the muscle as needed for anaphylaxis. 1 each 0  ? Multiple Vitamins-Minerals (MULTIVITAMIN WITH MINERALS) tablet Take 1 tablet by mouth daily.    ? rosuvastatin (CRESTOR) 10 MG tablet Take 1 tablet (10 mg total) by mouth daily. 90 tablet 3  ? ?No current facility-administered medications for this visit.  ? ?Allergies  ?Allergen Reactions  ? Bee Venom Swelling  ?  Facial, tongue and hand swelling ? ?Yellow Jackets  ? ? ? ?Review of Systems: ?All systems reviewed and negative except where noted in HPI.  ? ?Physical Exam: ?BP 112/66   Pulse 81   Ht '6\' 2"'$  (1.88 m)   Wt 244 lb 12.8 oz (111 kg)   SpO2 95%   BMI 31.43 kg/m?  ?Constitutional: Pleasant,well-developed, male in no acute distress. ?HEENT: Normocephalic and atraumatic.  Conjunctivae are normal. No scleral icterus. ?Cardiovascular: Normal rate, regular rhythm.  ?Pulmonary/chest: Effort normal and breath sounds normal. No wheezing, rales or rhonchi. ?Abdominal: Soft, nondistended, nontender. Bowel sounds active throughout. There are no masses palpable. No hepatomegaly. ?Extremities: No edema ?Neurological: Alert and oriented to person place and time. ?Skin: Skin is warm and dry. No rashes noted. ?Psychiatric: Normal mood and affect. Behavior is normal. ? ?Labs 03/2021: CBC nml. CMP with elevated biliburin 1.7.  ? ?CT A/P w/contrast 03/15/11: ?IMPRESSION:  ?There are.  Postoperative changes of appendectomy.  No inflammatory changes are demonstrated in the right lower quadrant.  Mild prominence of nonpathologically enlarged mesenteric and right lower quadrant lymph nodes could reflect mesenteric adenitis although nonspecific.  7 mm indeterminate nodule in the right lung base.  ?See follow-up recommendations above.  Flattening of the inferior vena cava suggesting dehydration. ? ?ASSESSMENT AND PLAN: ?Colon cancer screening ?Patient presents for to discuss colon cancer screening.  I went over the risks and benefits of the with the patient in detail.  He is agreeable to proceeding with the procedure. ?- Colonoscopy [facility].  Will reach out to his hematologist Dr. Lindi Adie about holding his Eliquis therapy for 2 days before his colonoscopy ? ?Christia Reading, MD ? ?

## 2021-06-21 NOTE — Telephone Encounter (Signed)
? ?  Gregory Montgomery Gregory Montgomery. ?May 22, 1965 ?[id number] ? ?Dear Dr.Gudena: ? ?We have scheduled the above named patient for a(n) Colonoscopy procedure. Our records show that (s)he is on anticoagulation therapy. ? ?Please advise as to whether the patient may come off their therapy of Eliquis 2 days prior to their procedure which is scheduled for 07/26/21. ? ?Please route your response to [facility] or fax response to [phone]. ? ?Sincerely, ? ? ? ?[facility] ?  ?

## 2021-06-21 NOTE — Patient Instructions (Signed)
If you are age 54 or older, your body mass index should be between 23-30. Your Body mass index is 31.43 kg/m?Marland Kitchen If this is out of the aforementioned range listed, please consider follow up with your Primary Care Provider. ? ?If you are age 64 or younger, your body mass index should be between 19-25. Your Body mass index is 31.43 kg/m?Marland Kitchen If this is out of the aformentioned range listed, please consider follow up with your Primary Care Provider.  ? ?You have been scheduled for a colonoscopy. Please follow written instructions given to you at your visit today.  ?Please pick up your prep supplies at the pharmacy within the next 1-3 days. ?If you use inhalers (even only as needed), please bring them with you on the day of your procedure. ? ? ?The [facility] providers would like to encourage you to use [facility] to communicate with providers for non-urgent requests or questions.  Due to long hold times on the telephone, sending your provider a message by [facility] may be a faster and more efficient way to get a response.  Please allow 48 business hours for a response.  Please remember that this is for non-urgent requests.  ? ?It was a pleasure to see you today! ? ?Thank you for trusting me with your gastrointestinal care!   ? ?Christia Reading, MD  ? ?

## 2021-06-24 ENCOUNTER — Other Ambulatory Visit (HOSPITAL_COMMUNITY): Payer: Self-pay

## 2021-06-29 NOTE — Telephone Encounter (Signed)
Called patient and let him know to hole Eliquis 48 hours prior to his procedure. Patient stated understanding and had no questions at the end of call. ?

## 2021-07-26 ENCOUNTER — Encounter: Payer: Self-pay | Admitting: Internal Medicine

## 2021-07-26 ENCOUNTER — Ambulatory Visit (AMBULATORY_SURGERY_CENTER): Payer: BC Managed Care – PPO | Admitting: Internal Medicine

## 2021-07-26 VITALS — BP 112/71 | HR 63 | Temp 96.2°F | Resp 17 | Ht 74.0 in | Wt 244.0 lb

## 2021-07-26 DIAGNOSIS — Z1211 Encounter for screening for malignant neoplasm of colon: Secondary | ICD-10-CM

## 2021-07-26 DIAGNOSIS — D123 Benign neoplasm of transverse colon: Secondary | ICD-10-CM | POA: Diagnosis not present

## 2021-07-26 DIAGNOSIS — D124 Benign neoplasm of descending colon: Secondary | ICD-10-CM

## 2021-07-26 MED ORDER — SODIUM CHLORIDE 0.9 % IV SOLN: 500.0000 mL | Freq: Once | INTRAVENOUS | Status: DC

## 2021-07-26 NOTE — Patient Instructions (Signed)
Restart Eliquis tomorrow ? ?YOU HAD AN ENDOSCOPIC PROCEDURE TODAY: Refer to the procedure report and other information in the discharge instructions given to you for any specific questions about what was found during the examination. If this information does not answer your questions, please call [place] office at [phone] to clarify.  ? ?YOU SHOULD EXPECT: Some feelings of bloating in the abdomen. Passage of more gas than usual. Walking can help get rid of the air that was put into your GI tract during the procedure and reduce the bloating. If you had a lower endoscopy (such as a colonoscopy or flexible sigmoidoscopy) you may notice spotting of blood in your stool or on the toilet paper. Some abdominal soreness may be present for a day or two, also. ? ?DIET: Your first meal following the procedure should be a light meal and then it is ok to progress to your normal diet. A half-sandwich or bowl of soup is an example of a good first meal. Heavy or fried foods are harder to digest and may make you feel nauseous or bloated. Drink plenty of fluids but you should avoid alcoholic beverages for 24 hours. If you had a esophageal dilation, please see attached instructions for diet.   ? ?ACTIVITY: Your care partner should take you home directly after the procedure. You should plan to take it easy, moving slowly for the rest of the day. You can resume normal activity the day after the procedure however YOU SHOULD NOT DRIVE, use power tools, machinery or perform tasks that involve climbing or major physical exertion for 24 hours (because of the sedation medicines used during the test).  ? ?SYMPTOMS TO REPORT IMMEDIATELY: ?A gastroenterologist can be reached at any hour. Please call [phone]  for any of the following symptoms:  ?Following lower endoscopy (colonoscopy, flexible sigmoidoscopy) ?Excessive amounts of blood in the stool  ?Significant tenderness, worsening of abdominal pains  ?Swelling of the abdomen that is  new, acute  ?Fever of 100? or higher  ?FOLLOW UP:  ?If any biopsies were taken you will be contacted by phone or by letter within the next 1-3 weeks. Call [phone]  if you have not heard about the biopsies in 3 weeks.  ?Please also call with any specific questions about appointments or follow up tests.  ?

## 2021-07-26 NOTE — Progress Notes (Signed)
Called to room to assist during endoscopic procedure.  Patient ID and intended procedure confirmed with present staff. Received instructions for my participation in the procedure from the performing physician.  

## 2021-07-26 NOTE — Progress Notes (Signed)
? ?GASTROENTEROLOGY PROCEDURE H&P NOTE  ? ?Primary Care Physician: ?Dorothyann Peng, NP ? ? ? ?Reason for Procedure:   Colon cancer screening ? ?Plan:    Colonoscopy ? ?Patient is appropriate for endoscopic procedure(s) in the ambulatory ([place]) setting. ? ?The nature of the procedure, as well as the risks, benefits, and alternatives were carefully and thoroughly reviewed with the patient. Ample time for discussion and questions allowed. The patient understood, was satisfied, and agreed to proceed.  ? ? ? ?HPI: ?Gregory Montgomery. is a 56 y.o. male who presents for colonoscopy for evaluation of colon cancer screening .  Patient was most recently seen in the Gastroenterology Clinic on 06/21/21.  No interval change in medical history since that appointment. Please refer to that note for full details regarding GI history and clinical presentation.  ? ?Past Medical History:  ?Diagnosis Date  ? Chicken pox   ? DVT (deep venous thrombosis) ([place]) 09/2017  ? Hyperlipidemia   ? ? ?Past Surgical History:  ?Procedure Laterality Date  ? APPENDECTOMY    ? CIRCUMCISION    ? newborn and at age 39  ? ? ?Prior to Admission medications   ?Medication Sig Start Date End Date Taking? Authorizing Provider  ?apixaban (ELIQUIS) 5 MG TABS tablet TAKE 1 TABLET BY MOUTH 2 TIMES DAILY 04/08/21 04/08/22  Nicholas Lose, MD  ?EPINEPHrine 0.3 mg/0.3 mL IJ SOAJ injection Inject 0.3 mg into the muscle as needed for anaphylaxis. 09/05/20   Orpah Greek, MD  ?Multiple Vitamins-Minerals (MULTIVITAMIN WITH MINERALS) tablet Take 1 tablet by mouth daily.    [provider]  ?rosuvastatin (CRESTOR) 10 MG tablet Take 1 tablet (10 mg total) by mouth daily. 04/12/21   Dorothyann Peng, NP  ? ? ?Current Outpatient Medications  ?Medication Sig Dispense Refill  ? apixaban (ELIQUIS) 5 MG TABS tablet TAKE 1 TABLET BY MOUTH 2 TIMES DAILY 60 tablet 11  ? EPINEPHrine 0.3 mg/0.3 mL IJ SOAJ injection Inject 0.3 mg into the muscle as needed for  anaphylaxis. 1 each 0  ? Multiple Vitamins-Minerals (MULTIVITAMIN WITH MINERALS) tablet Take 1 tablet by mouth daily.    ? rosuvastatin (CRESTOR) 10 MG tablet Take 1 tablet (10 mg total) by mouth daily. 90 tablet 3  ? ?Current Facility-Administered Medications  ?Medication Dose Route Frequency Provider Last Rate Last Admin  ? 0.9 %  sodium chloride infusion  500 mL Intravenous Once Sharyn Creamer, MD      ? ? ?Allergies as of 07/26/2021 - Review Complete 06/21/2021  ?Allergen Reaction Noted  ? Bee venom Swelling 04/08/2021  ? ? ?Family History  ?Problem Relation Age of Onset  ? Stroke Mother   ? Hypertension Mother   ? Allergies Daughter   ? Colon cancer Neg Hx   ? Stomach cancer Neg Hx   ? Esophageal cancer Neg Hx   ? ? ?Social History  ? ?Socioeconomic History  ? Marital status: Married  ?  Spouse name: Not on file  ? Number of children: Not on file  ? Years of education: Not on file  ? Highest education level: Not on file  ?Occupational History  ? Not on file  ?Tobacco Use  ? Smoking status: Never  ? Smokeless tobacco: Never  ?Vaping Use  ? Vaping Use: Never used  ?Substance and Sexual Activity  ? Alcohol use: No  ? Drug use: No  ? Sexual activity: Not on file  ?Other Topics Concern  ? Not on file  ?Social History Narrative  ?  Married for 20 years   ? One daughters ( 5 years old) Lives local   ?   ?   ? Likes to ride his motorcycle and weight training.   ? ?Social Determinants of Health  ? ?Financial Resource Strain: Not on file  ?Food Insecurity: Not on file  ?Transportation Needs: Not on file  ?Physical Activity: Not on file  ?Stress: Not on file  ?Social Connections: Not on file  ?Intimate Partner Violence: Not on file  ? ? ?Physical Exam: ?Vital signs in last 24 hours: ?BP 117/74   Pulse 74   Temp (!) 96.2 ?F (35.7 ?C) (Skin)   Ht '6\' 2"'$  (1.88 m)   Wt 244 lb (110.7 kg)   SpO2 96%   BMI 31.33 kg/m?  ?GEN: NAD ?EYE: Sclerae anicteric ?ENT: MMM ?CV: Non-tachycardic ?Pulm: No increased WOB ?GI: Soft ?NEURO:   Alert & Oriented ? ? ?Christia Reading, MD ?[facility] ? ? ?07/26/2021 1:00 PM ? ?

## 2021-07-26 NOTE — Op Note (Signed)
[facility] ?Patient Name: Gregory Montgomery ?Procedure Date: 07/26/2021 1:34 PM ?MRN: [id number] ?Endoscopist: Sonny Masters "Christia Reading ,  ?Age: 56 ?Referring MD:  ?Date of Birth: 1965/09/27 ?Gender: Male ?Account #: [id number] ?Procedure:                Colonoscopy ?Indications:              Screening for colorectal malignant neoplasm, This  ?                          is the patient's first colonoscopy ?Medicines:                Monitored Anesthesia Care ?Procedure:                Pre-Anesthesia Assessment: ?                          - Prior to the procedure, a History and Physical  ?                          was performed, and patient medications and  ?                          allergies were reviewed. The patient's tolerance of  ?                          previous anesthesia was also reviewed. The risks  ?                          and benefits of the procedure and the sedation  ?                          options and risks were discussed with the patient.  ?                          All questions were answered, and informed consent  ?                          was obtained. Prior Anticoagulants: The patient has  ?                          taken Eliquis (apixaban), last dose was 2 days  ?                          prior to procedure. ASA Grade Assessment: II - A  ?                          patient with mild systemic disease. After reviewing  ?                          the risks and benefits, the patient was deemed in  ?                          satisfactory condition to undergo the procedure. ?  After obtaining informed consent, the colonoscope  ?                          was passed under direct vision. Throughout the  ?                          procedure, the patient's blood pressure, pulse, and  ?                          oxygen saturations were monitored continuously. The  ?                          Olympus CF-HQ190L ([id number]) Colonoscope was  ?                          introduced through  the anus and advanced to the the  ?                          terminal ileum. The colonoscopy was performed  ?                          without difficulty. The patient tolerated the  ?                          procedure well. The quality of the bowel  ?                          preparation was good. The terminal ileum, ileocecal  ?                          valve, appendiceal orifice, and rectum were  ?                          photographed. ?Scope In: 1:48:58 PM ?Scope Out: 2:11:10 PM ?Scope Withdrawal Time: 0 hours 16 minutes 49 seconds  ?Total Procedure Duration: 0 hours 22 minutes 12 seconds  ?Findings:                 The terminal ileum appeared normal. ?                          Three sessile polyps were found in the descending  ?                          colon and transverse colon. The polyps were 3 to 5  ?                          mm in size. These polyps were removed with a cold  ?                          snare. Resection and retrieval were complete. ?                          Non-bleeding internal hemorrhoids were found during  ?  retroflexion. ?Complications:            No immediate complications. ?Estimated Blood Loss:     Estimated blood loss was minimal. ?Impression:               - The examined portion of the ileum was normal. ?                          - Three 3 to 5 mm polyps in the descending colon  ?                          and in the transverse colon, removed with a cold  ?                          snare. Resected and retrieved. ?                          - Non-bleeding internal hemorrhoids. ?Recommendation:           - Discharge patient to home (with escort). ?                          - Await pathology results. ?                          - Okay to restart Eliquis tomorrow. ?                          - The findings and recommendations were discussed  ?                          with the patient. ?Georgian Co,  ?07/26/2021 2:16:19 PM ?

## 2021-07-26 NOTE — Progress Notes (Signed)
PT taken to PACU. Monitors in place. VSS. Report given to RN. 

## 2021-07-28 ENCOUNTER — Telehealth: Payer: Self-pay | Admitting: *Deleted

## 2021-07-28 NOTE — Telephone Encounter (Signed)
  Follow up Call-     07/26/2021   12:58 PM  Call back number  Post procedure Call Back phone  # [phone]  Permission to leave phone message Yes     Patient questions:  Do you have a fever, pain , or abdominal swelling? No. Pain Score  0 *  Have you tolerated food without any problems? Yes.    Have you been able to return to your normal activities? Yes.    Do you have any questions about your discharge instructions: Diet   No. Medications  No. Follow up visit  No.  Do you have questions or concerns about your Care? No.  Actions: * If pain score is 4 or above: No action needed, pain <4.

## 2021-07-29 ENCOUNTER — Encounter: Payer: Self-pay | Admitting: Internal Medicine

## 2021-08-02 ENCOUNTER — Other Ambulatory Visit (HOSPITAL_COMMUNITY): Payer: Self-pay

## 2021-09-14 ENCOUNTER — Other Ambulatory Visit (HOSPITAL_COMMUNITY): Payer: Self-pay

## 2021-09-22 ENCOUNTER — Telehealth: Payer: Self-pay

## 2021-09-22 NOTE — Telephone Encounter (Signed)
---  Caller states he was stung by yellow jackets yesterday twice and took benadryl. He is allergic.He did not use his EPIpen and wants to know if he should. Has had an anaphylactic reaction. Had benadryl and not his epipen. Finger stung is swollen at knuckle, leg is swollen at site. No trouble breathing. No swelling to lips.  09/22/2021 2:27:17 [place], RN, Marcie Bal  Comments User: Gregory Charity, RN Date/Time Eilene Ghazi Time): 09/22/2021 2:26:57 PM no ibuprofen d/t blood thinners. Advised he should carry epipen as he has had anaphylactic rx before and is doing lawn work. User: Gregory Charity, RN Date/Time Eilene Ghazi Time): 09/22/2021 2:27:56 PM last time he walked thru a nest   Care Advice Given Per Guideline HOME CARE: * You should be able to treat this at home. REASSURANCE AND EDUCATION - LOCAL REACTION TO STING: * It sounds like a routine bee (wasp, yellow jacket) sting that we can treat at home. * The main symptoms are localized pain, swelling, itching, and mild redness at the sting site. PAIN MEDICINES FOR STING: * ACETAMINOPHEN - REGULAR STRENGTH TYLENOL: Take 650 mg (two 325 mg pills) by mouth. * ACETAMINOPHEN - EXTRA STRENGTH TYLENOL: Take 1,000 mg (two 500 mg pills). * For pain relief, you can take either acetaminophen or ibuprofen. * They are over-the-counter (OTC) pain drugs. You can buy them at the drugstore. EXPECTED COURSE: * Pain: Severe pain or burning at the site lasts 1 to 2 hours. Pain after this period is usually minimal. Itching often follows the pain. * Stings only rarely get infected. * Redness and Swelling: Normal redness and swelling from the venom can increase for 24 hours following the sting. Redness at the sting site is normal. It doesn't mean that it is infected. The redness can last 3 days and the swelling 7 days. * Sting begins to look infected * You become worse * Swelling becomes huge CALL BACK IF: CARE ADVICE given per Bee or Yellow Jacket  Sting (Adult) guideline. ANTIHISTAMINE MEDICINES - EXTRA NOTES AND WARNINGS: * Antihistamine medicines can be used to treat allergic reactions, allergies, hay fever, hives, and itching. * Diphenhydramine (Benadryl) is a FIRST GENERATION ANTIHISTAMINE medicine. It can make you more sleepy than the newer second generation antihistamine medicines. The adult dose of Benadryl is 25 to 50 mg by mouth. You can take it up to 4 times a day.

## 2021-10-24 ENCOUNTER — Other Ambulatory Visit (HOSPITAL_COMMUNITY): Payer: Self-pay

## 2021-12-06 ENCOUNTER — Other Ambulatory Visit (HOSPITAL_COMMUNITY): Payer: Self-pay

## 2022-01-27 ENCOUNTER — Other Ambulatory Visit (HOSPITAL_COMMUNITY): Payer: Self-pay

## 2022-02-03 ENCOUNTER — Other Ambulatory Visit: Payer: Self-pay | Admitting: Family Medicine

## 2022-02-03 DIAGNOSIS — L309 Dermatitis, unspecified: Secondary | ICD-10-CM

## 2022-03-10 ENCOUNTER — Other Ambulatory Visit (HOSPITAL_COMMUNITY): Payer: Self-pay

## 2022-04-18 ENCOUNTER — Encounter: Payer: Self-pay | Admitting: Adult Health

## 2022-04-18 ENCOUNTER — Ambulatory Visit: Payer: BC Managed Care – PPO | Admitting: Adult Health

## 2022-04-18 VITALS — BP 122/80 | HR 87 | Temp 97.5°F | Ht 74.0 in | Wt 251.0 lb

## 2022-04-18 DIAGNOSIS — T148XXA Other injury of unspecified body region, initial encounter: Secondary | ICD-10-CM

## 2022-04-18 DIAGNOSIS — Z23 Encounter for immunization: Secondary | ICD-10-CM | POA: Diagnosis not present

## 2022-04-18 MED ORDER — METHYLPREDNISOLONE 4 MG PO TBPK: ORAL_TABLET | ORAL | 0 refills | Status: DC

## 2022-04-18 NOTE — Progress Notes (Signed)
Subjective:    Patient ID: Gregory Bass., male    DOB: 1966/03/07, 57 y.o.   MRN: [id number]  HPI 57 year old male who  has a past medical history of Chicken pox, DVT (deep venous thrombosis) ([place]) (09/2017), and Hyperlipidemia.  He presents to the office today for an acute issue. He reports that about two weeks ago he bent down to tie his shoes and felt a sharp pain in his left flank. This pain lasted about 3 days and then resolved. Three days ago he was reaching over his head and felt the same type of pain which lasted a few days then resolved for the most part but still have pain with bending, twisting and raising arm.   Denies issues with bowel or bladder.   Review of Systems See HPI   Past Medical History:  Diagnosis Date   Chicken pox    DVT (deep venous thrombosis) ([place]) 09/2017   Hyperlipidemia     Social History   Socioeconomic History   Marital status: Married    Spouse name: Not on file   Number of children: Not on file   Years of education: Not on file   Highest education level: Not on file  Occupational History   Not on file  Tobacco Use   Smoking status: Never   Smokeless tobacco: Never  Vaping Use   Vaping Use: Never used  Substance and Sexual Activity   Alcohol use: No   Drug use: No   Sexual activity: Not on file  Other Topics Concern   Not on file  Social History Narrative   Married for 32 years    One daughters ( 32 years old) Lives local          Likes to ride his motorcycle and [employer] training.    Social Determinants of Health   Financial Resource Strain: Not on file  Food Insecurity: Not on file  Transportation Needs: Not on file  Physical Activity: Not on file  Stress: Not on file  Social Connections: Not on file  Intimate Partner Violence: Not on file    Past Surgical History:  Procedure Laterality Date   APPENDECTOMY     CIRCUMCISION     newborn and at age 14    Family History  Problem Relation Age of Onset    Stroke Gregory Montgomery    Hypertension Gregory Montgomery    Allergies Gregory Montgomery    Colon cancer Neg Hx    Stomach cancer Neg Hx    Esophageal cancer Neg Hx     Allergies  Allergen Reactions   Bee Venom Swelling    Facial, tongue and hand swelling  Yellow Jackets    Current Outpatient Medications on File Prior to Visit  Medication Sig Dispense Refill   apixaban (ELIQUIS) 5 MG TABS tablet TAKE 1 TABLET BY MOUTH 2 TIMES DAILY 60 tablet 11   EPINEPHrine 0.3 mg/0.3 mL IJ SOAJ injection Inject 0.3 mg into the muscle as needed for anaphylaxis. 1 each 0   Multiple Vitamins-Minerals (MULTIVITAMIN WITH MINERALS) tablet Take 1 tablet by mouth daily.     rosuvastatin (CRESTOR) 10 MG tablet Take 1 tablet (10 mg total) by mouth daily. 90 tablet 3   No current facility-administered medications on file prior to visit.    BP 122/80   Pulse 87   Temp (!) 97.5 F (36.4 C) (Oral)   Ht '6\' 2"'$  (1.88 m)   Wt 251 lb (113.9 kg)   BMI 32.23  kg/m       Objective:   Physical Exam Vitals and nursing note reviewed.  Constitutional:      Appearance: Normal appearance.  Cardiovascular:     Rate and Rhythm: Normal rate and regular rhythm.     Pulses: Normal pulses.     Heart sounds: Normal heart sounds.  Pulmonary:     Effort: Pulmonary effort is normal.     Breath sounds: Normal breath sounds.  Abdominal:     General: Abdomen is flat. Bowel sounds are normal. There is no distension.     Palpations: Abdomen is soft.     Tenderness: There is no right CVA tenderness, left CVA tenderness or rebound.  Musculoskeletal:        General: Tenderness (has tenderness with palpation to lower latissimus dorsi) present. No swelling, deformity or signs of injury. Normal range of motion.  Skin:    General: Skin is warm and dry.     Capillary Refill: Capillary refill takes less than 2 seconds.  Neurological:     General: No focal deficit present.     Mental Status: He is alert and oriented to person, place, and time.   Psychiatric:        Mood and Affect: Mood normal.        Behavior: Behavior normal.        Thought Content: Thought content normal.        Judgment: Judgment normal.       Assessment & Plan:   1. Muscle strain  - methylPREDNISolone (MEDROL DOSEPAK) 4 MG TBPK tablet; Take as directed  Dispense: 21 tablet; Refill: 0

## 2022-05-02 ENCOUNTER — Other Ambulatory Visit: Payer: Self-pay | Admitting: Hematology and Oncology

## 2022-05-02 ENCOUNTER — Other Ambulatory Visit (HOSPITAL_COMMUNITY): Payer: Self-pay

## 2022-05-02 ENCOUNTER — Telehealth: Payer: Self-pay | Admitting: Hematology and Oncology

## 2022-05-02 MED ORDER — APIXABAN 5 MG PO TABS
5.0000 mg | ORAL_TABLET | Freq: Two times a day (BID) | ORAL | 0 refills | Status: DC
  Filled 2022-05-02: qty 60, 30d supply, fill #0

## 2022-05-02 NOTE — Telephone Encounter (Signed)
Per 2/20 IB reached out and scheduled patient. Patient aware of date and time of appointment.

## 2022-05-08 NOTE — Telephone Encounter (Signed)
Per 2/20 IB scheduled patient.

## 2022-05-19 NOTE — Progress Notes (Signed)
HEMATOLOGY-ONCOLOGY TELEPHONE VISIT PROGRESS NOTE  I connected with our patient on 05/25/22 at  2:45 PM EDT by telephone and verified that I am speaking with the correct person using two identifiers.  I discussed the limitations, risks, security and privacy concerns of performing an evaluation and management service by telephone and the availability of in person appointments.  I also discussed with the patient that there may be a patient responsible charge related to this service. The patient expressed understanding and agreed to proceed.   History of Present Illness: Gregory Montgomery is a 57 y.o. with a history of acute, unprovoked DVT who is currently on Xarelto. She presents to the clinic for a telephone follow-up to discuss continuation of anticoagulation.    REVIEW OF SYSTEMS:   Constitutional: Denies fevers, chills or abnormal weight loss All other systems were reviewed with the patient and are negative.    Assessment Plan:  Agents Dr. Payton Mccallum here acute deep vein thrombosis (DVT) of tibial vein of left lower extremity ([place]) 09/15/2017: Acute DVT posterior tibial vein on the left Single unprovoked DVT   Current treatment: Xarelto indefinitely Xarelto adverse effect: Erectile dysfunction.   Patient does not have any inherited risk factors. February 2020: Lupus anticoagulant positive, repeat positive on 09/12/2018  Had colonoscopy Nov 2023: 3 polyps. Telephone visit in 1 year    I discussed the assessment and treatment plan with the patient. The patient was provided an opportunity to ask questions and all were answered. The patient agreed with the plan and demonstrated an understanding of the instructions. The patient was advised to call back or seek an in-person evaluation if the symptoms worsen or if the condition fails to improve as anticipated.   I provided 12 minutes of non-face-to-face time during this encounter.  This includes time for charting and coordination of care   Harriette Ohara, MD  I Gardiner Coins am acting as a scribe for Dr.Vinay Gudena  I have reviewed the above documentation for accuracy and completeness, and I agree with the above.

## 2022-05-25 ENCOUNTER — Inpatient Hospital Stay: Payer: BC Managed Care – PPO | Attending: Hematology and Oncology | Admitting: Hematology and Oncology

## 2022-05-25 ENCOUNTER — Other Ambulatory Visit (HOSPITAL_COMMUNITY): Payer: Self-pay

## 2022-05-25 DIAGNOSIS — I82442 Acute embolism and thrombosis of left tibial vein: Secondary | ICD-10-CM

## 2022-05-25 MED ORDER — APIXABAN 5 MG PO TABS
5.0000 mg | ORAL_TABLET | Freq: Two times a day (BID) | ORAL | 3 refills | Status: DC
  Filled 2022-05-25 – 2022-06-21 (×2): qty 60, 30d supply, fill #0
  Filled 2022-08-14 (×2): qty 60, 30d supply, fill #1
  Filled 2022-09-25: qty 60, 30d supply, fill #2
  Filled 2022-10-30: qty 60, 30d supply, fill #3
  Filled 2022-12-15 – 2022-12-18 (×2): qty 60, 30d supply, fill #4
  Filled 2023-01-24: qty 60, 30d supply, fill #5
  Filled 2023-03-03: qty 60, 30d supply, fill #6
  Filled 2023-04-12: qty 60, 30d supply, fill #7
  Filled 2023-05-23: qty 60, 30d supply, fill #8

## 2022-05-25 NOTE — Assessment & Plan Note (Signed)
09/15/2017: Acute DVT posterior tibial vein on the left Single unprovoked DVT I discussed with the patient risk factors for blood clots.   Current treatment: Xarelto indefinitely Xarelto adverse effect: Erectile dysfunction.   Patient does not have any inherited risk factors. February 2020: Lupus anticoagulant positive, repeat positive on 09/12/2018  Telephone visit in 1 year

## 2022-06-02 ENCOUNTER — Other Ambulatory Visit (HOSPITAL_COMMUNITY): Payer: Self-pay

## 2022-06-21 ENCOUNTER — Other Ambulatory Visit (HOSPITAL_COMMUNITY): Payer: Self-pay

## 2022-06-27 ENCOUNTER — Other Ambulatory Visit: Payer: Self-pay | Admitting: Adult Health

## 2022-08-14 ENCOUNTER — Other Ambulatory Visit (HOSPITAL_COMMUNITY): Payer: Self-pay

## 2022-10-30 ENCOUNTER — Other Ambulatory Visit (HOSPITAL_COMMUNITY): Payer: Self-pay

## 2022-12-16 ENCOUNTER — Other Ambulatory Visit (HOSPITAL_COMMUNITY): Payer: Self-pay

## 2022-12-18 ENCOUNTER — Other Ambulatory Visit (HOSPITAL_COMMUNITY): Payer: Self-pay

## 2023-01-03 ENCOUNTER — Encounter: Payer: Self-pay | Admitting: Adult Health

## 2023-01-03 ENCOUNTER — Ambulatory Visit: Payer: BC Managed Care – PPO | Admitting: Adult Health

## 2023-01-03 VITALS — BP 110/80 | HR 68 | Temp 98.2°F | Ht 74.0 in | Wt 239.0 lb

## 2023-01-03 DIAGNOSIS — R051 Acute cough: Secondary | ICD-10-CM

## 2023-01-03 MED ORDER — BENZONATATE 200 MG PO CAPS: 200.0000 mg | ORAL_CAPSULE | Freq: Three times a day (TID) | ORAL | 0 refills | Status: DC | PRN

## 2023-01-03 MED ORDER — METHYLPREDNISOLONE 4 MG PO TBPK: ORAL_TABLET | ORAL | 0 refills | Status: DC

## 2023-01-03 NOTE — Progress Notes (Signed)
Subjective:    Patient ID: Gregory Huger., male    DOB: 31-Mar-1965, 57 y.o.   MRN: [id number]  HPI  57 year old male who  has a past medical history of Chicken pox, DVT (deep venous thrombosis) (HCC) (09/2017), and Hyperlipidemia.  He presents to the office today for an acute issue. He reports that for the last six weeks he has had a a dry nagging cough. He denies SOB, wheezing, fevers, chills, sinus pain or pressure.  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   At home he has been using Delsym without relief.    Review of Systems See HPI   Past Medical History:  Diagnosis Date   Chicken pox    DVT (deep venous thrombosis) (HCC) 09/2017   Hyperlipidemia     Social History   Socioeconomic History   Marital status: Married    Spouse name: Not on file   Number of children: Not on file   Years of education: Not on file   Highest education level: Not on file  Occupational History   Not on file  Tobacco Use   Smoking status: Never   Smokeless tobacco: Never  Vaping Use   Vaping status: Never Used  Substance and Sexual Activity   Alcohol use: No   Drug use: No   Sexual activity: Not on file  Other Topics Concern   Not on file  Social History Narrative   Married for 20 years    One daughters ( 66 years old) Lives local          Likes to ride his motorcycle and [employer] training.    Social Determinants of Health   Financial Resource Strain: Not on file  Food Insecurity: Not on file  Transportation Needs: Not on file  Physical Activity: Not on file  Stress: Not on file  Social Connections: Not on file  Intimate Partner Violence: Not on file    Past Surgical History:  Procedure Laterality Date   APPENDECTOMY     CIRCUMCISION     newborn and at age 52    Family History  Problem Relation Age of Onset   Stroke Mother    Hypertension Mother    Allergies Daughter    Colon cancer Neg Hx    Stomach cancer Neg Hx    Esophageal cancer Neg Hx     Allergies   Allergen Reactions   Bee Venom Swelling    Facial, tongue and hand swelling  Yellow Jackets    Current Outpatient Medications on File Prior to Visit  Medication Sig Dispense Refill   apixaban (ELIQUIS) 5 MG TABS tablet Take 1 tablet (5 mg total) by mouth 2 (two) times daily. 180 tablet 3   EPINEPHrine 0.3 mg/0.3 mL IJ SOAJ injection Inject 0.3 mg into the muscle as needed for anaphylaxis. 1 each 0   Multiple Vitamins-Minerals (MULTIVITAMIN WITH MINERALS) tablet Take 1 tablet by mouth daily.     rosuvastatin (CRESTOR) 10 MG tablet TAKE 1 TABLET BY MOUTH EVERY DAY 90 tablet 3   No current facility-administered medications on file prior to visit.    BP 110/80   Pulse 68   Temp 98.2 F (36.8 C) (Oral)   Ht 6\' 2"  (1.88 m)   Wt 239 lb (108.4 kg)   SpO2 96%   BMI 30.69 kg/m       Objective:   Physical Exam Vitals and nursing note reviewed.  Constitutional:      Appearance:  Normal appearance.  Cardiovascular:     Rate and Rhythm: Normal rate and regular rhythm.     Pulses: Normal pulses.     Heart sounds: Normal heart sounds.  Pulmonary:     Effort: Pulmonary effort is normal.     Breath sounds: Normal breath sounds.  Musculoskeletal:        General: Normal range of motion.  Skin:    General: Skin is warm and dry.  Neurological:     General: No focal deficit present.     Mental Status: He is alert and oriented to person, place, and time.  Psychiatric:        Mood and Affect: Mood normal.        Behavior: Behavior normal.        Thought Content: Thought content normal.        Judgment: Judgment normal.       Assessment & Plan:  1. Acute cough - likely allergy mediated. Will place him on medrol dose pack and tessalon pearls. Follow up if cough does not resolve with this treatment  - methylPREDNISolone (MEDROL DOSEPAK) 4 MG TBPK tablet; Take as directed  Dispense: 21 tablet; Refill: 0 - benzonatate (TESSALON) 200 MG capsule; Take 1 capsule (200 mg total) by mouth 3  (three) times daily as needed for cough.  Dispense: 30 capsule; Refill: 0  Shirline Frees, NP

## 2023-01-25 ENCOUNTER — Other Ambulatory Visit (HOSPITAL_COMMUNITY): Payer: Self-pay

## 2023-01-26 ENCOUNTER — Encounter: Payer: Self-pay | Admitting: Adult Health

## 2023-01-26 ENCOUNTER — Ambulatory Visit (INDEPENDENT_AMBULATORY_CARE_PROVIDER_SITE_OTHER): Payer: BC Managed Care – PPO | Admitting: Adult Health

## 2023-01-26 VITALS — BP 120/88 | HR 58 | Temp 97.6°F | Ht 73.0 in | Wt 243.0 lb

## 2023-01-26 DIAGNOSIS — Z23 Encounter for immunization: Secondary | ICD-10-CM

## 2023-01-26 DIAGNOSIS — E782 Mixed hyperlipidemia: Secondary | ICD-10-CM

## 2023-01-26 DIAGNOSIS — Z125 Encounter for screening for malignant neoplasm of prostate: Secondary | ICD-10-CM | POA: Diagnosis not present

## 2023-01-26 DIAGNOSIS — I82442 Acute embolism and thrombosis of left tibial vein: Secondary | ICD-10-CM | POA: Diagnosis not present

## 2023-01-26 DIAGNOSIS — Z0000 Encounter for general adult medical examination without abnormal findings: Secondary | ICD-10-CM | POA: Diagnosis not present

## 2023-01-26 LAB — COMPREHENSIVE METABOLIC PANEL
ALT: 16 U/L (ref 0–53)
AST: 13 U/L (ref 0–37)
Albumin: 4.1 g/dL (ref 3.5–5.2)
Alkaline Phosphatase: 44 U/L (ref 39–117)
BUN: 15 mg/dL (ref 6–23)
CO2: 29 meq/L (ref 19–32)
Calcium: 8.8 mg/dL (ref 8.4–10.5)
Chloride: 104 meq/L (ref 96–112)
Creatinine, Ser: 1.21 mg/dL (ref 0.40–1.50)
GFR: 67.42 mL/min (ref >60.00)
Glucose, Bld: 86 mg/dL (ref 70–99)
Potassium: 4.5 meq/L (ref 3.5–5.1)
Sodium: 139 meq/L (ref 135–145)
Total Bilirubin: 1.3 mg/dL — ABNORMAL HIGH (ref 0.2–1.2)
Total Protein: 6.5 g/dL (ref 6.0–8.3)

## 2023-01-26 LAB — CBC
HCT: 47.7 % (ref 39.0–52.0)
Hemoglobin: 15.7 g/dL (ref 13.0–17.0)
MCHC: 33 g/dL (ref 30.0–36.0)
MCV: 87 fL (ref 78.0–100.0)
Platelets: 145 10*3/uL — ABNORMAL LOW (ref 150.0–400.0)
RBC: 5.48 Mil/uL (ref 4.22–5.81)
RDW: 14.7 % (ref 11.5–15.5)
WBC: 4.1 10*3/uL (ref 4.0–10.5)

## 2023-01-26 LAB — LIPID PANEL
Cholesterol: 148 mg/dL (ref 0–200)
HDL: 51.1 mg/dL (ref >39.00)
LDL Cholesterol: 87 mg/dL (ref 0–99)
NonHDL: 96.66
Total CHOL/HDL Ratio: 3
Triglycerides: 48 mg/dL (ref 0.0–149.0)
VLDL: 9.6 mg/dL (ref 0.0–40.0)

## 2023-01-26 LAB — PSA: PSA: 0.89 ng/mL (ref 0.10–4.00)

## 2023-01-26 NOTE — Progress Notes (Signed)
Subjective:    Patient ID: Gregory Montgomery., male    DOB: [birth year], 57 y.o.   MRN: [id number]  HPI Patient presents for yearly preventative medicine examination. He is a pleasant 57 year old male who  has a past medical history of Chicken pox, DVT (deep venous thrombosis) (HCC) (09/2017), and Hyperlipidemia.  H/o DVT of tibial vein of left lower extremity -in 2019.  This was unprovoked.  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 Denes bleeding issues, shortness of breath, chest pain, or Lower extremity edema   Hyperlipidemia - managed with crestor 10 mg daily. He denies myalgia or fatigue  Lab Results  Component Value Date   CHOL 244 (H) 04/08/2021   HDL 53 04/08/2021   LDLCALC 175 (H) 04/08/2021   TRIG 60 04/08/2021   CHOLHDL 4.6 04/08/2021    All immunizations and health maintenance protocols were reviewed with the patient and needed orders were placed.  Appropriate screening laboratory values were ordered for the patient including screening of hyperlipidemia, renal function and hepatic function. If indicated by BPH, a PSA was ordered.  Medication reconciliation,  past medical history, social history, problem list and allergies were reviewed in detail with the patient  Goals were established with regard to weight loss, exercise, and  diet in compliance with medications.   Wt Readings from Last 3 Encounters:  01/26/23 243 lb (110.2 kg)  01/03/23 239 lb (108.4 kg)  04/18/22 251 lb (113.9 kg)   He is up to date on routine colon cancer screening    Review of Systems  Constitutional: Negative.   HENT: Negative.    Eyes: Negative.   Respiratory: Negative.    Cardiovascular: Negative.   Gastrointestinal: Negative.   Endocrine: Negative.   Genitourinary: Negative.   Musculoskeletal: Negative.   Skin: Negative.   Allergic/Immunologic: Negative.   Neurological: Negative.   Hematological: Negative.    Psychiatric/Behavioral: Negative.    All other systems reviewed and are negative.  Past Medical History:  Diagnosis Date   Chicken pox    DVT (deep venous thrombosis) (HCC) 09/2017   Hyperlipidemia     Social History   Socioeconomic History   Marital status: Married    Spouse name: Not on file   Number of children: Not on file   Years of education: Not on file   Highest education level: Not on file  Occupational History   Not on file  Tobacco Use   Smoking status: Never   Smokeless tobacco: Never  Vaping Use   Vaping status: Never Used  Substance and Sexual Activity   Alcohol use: No   Drug use: No   Sexual activity: Not on file  Other Topics Concern   Not on file  Social History Narrative   Married for 20 years    One daughters ( 71 years old) Lives local          Likes to ride his motorcycle and [employer] training.    Social Determinants of Health   Financial Resource Strain: Not on file  Food Insecurity: Not on file  Transportation Needs: Not on file  Physical Activity: Not on file  Stress: Not on file  Social Connections: Not on file  Intimate Partner Violence: Not on file    Past Surgical History:  Procedure Laterality Date   APPENDECTOMY     CIRCUMCISION     newborn and at age 25    Family History  Problem Relation  Age of Onset   Stroke Mother    Hypertension Mother    Allergies Daughter    Colon cancer Neg Hx    Stomach cancer Neg Hx    Esophageal cancer Neg Hx     Allergies  Allergen Reactions   Bee Venom Swelling    Facial, tongue and hand swelling  Yellow Jackets    Current Outpatient Medications on File Prior to Visit  Medication Sig Dispense Refill   apixaban (ELIQUIS) 5 MG TABS tablet Take 1 tablet (5 mg total) by mouth 2 (two) times daily. 180 tablet 3   EPINEPHrine 0.3 mg/0.3 mL IJ SOAJ injection Inject 0.3 mg into the muscle as needed for anaphylaxis. 1 each 0   Multiple Vitamins-Minerals (MULTIVITAMIN WITH MINERALS) tablet  Take 1 tablet by mouth daily.     rosuvastatin (CRESTOR) 10 MG tablet TAKE 1 TABLET BY MOUTH EVERY DAY 90 tablet 3   No current facility-administered medications on file prior to visit.    BP 120/88   Pulse (!) 58   Temp 97.6 F (36.4 C) (Oral)   Ht 6\' 1"  (1.854 m)   Wt 243 lb (110.2 kg)   SpO2 95%   BMI 32.06 kg/m       Objective:   Physical Exam Vitals and nursing note reviewed.  Constitutional:      General: He is not in acute distress.    Appearance: Normal appearance. He is obese. He is not ill-appearing.  HENT:     Head: Normocephalic and atraumatic.     Right Ear: Tympanic membrane, ear canal and external ear normal. There is no impacted cerumen.     Left Ear: Tympanic membrane, ear canal and external ear normal. There is no impacted cerumen.     Nose: Nose normal. No congestion or rhinorrhea.     Mouth/Throat:     Mouth: Mucous membranes are moist.     Pharynx: Oropharynx is clear.  Eyes:     Extraocular Movements: Extraocular movements intact.     Conjunctiva/sclera: Conjunctivae normal.     Pupils: Pupils are equal, round, and reactive to light.  Neck:     Vascular: No carotid bruit.  Cardiovascular:     Rate and Rhythm: Normal rate and regular rhythm.     Pulses: Normal pulses.     Heart sounds: No murmur heard.    No friction rub. No gallop.  Pulmonary:     Effort: Pulmonary effort is normal.     Breath sounds: Normal breath sounds.  Abdominal:     General: Abdomen is flat. Bowel sounds are normal. There is no distension.     Palpations: Abdomen is soft. There is no mass.     Tenderness: There is no abdominal tenderness. There is no guarding or rebound.     Hernia: No hernia is present.  Musculoskeletal:        General: Normal range of motion.     Cervical back: Normal range of motion and neck supple.  Lymphadenopathy:     Cervical: No cervical adenopathy.  Skin:    General: Skin is warm and dry.     Capillary Refill: Capillary refill takes less  than 2 seconds.  Neurological:     General: No focal deficit present.     Mental Status: He is alert and oriented to person, place, and time.  Psychiatric:        Mood and Affect: Mood normal.        Behavior: Behavior normal.  Thought Content: Thought content normal.        Judgment: Judgment normal.        Assessment & Plan:  1. Routine general medical examination at a health care facility Today patient counseled on age appropriate routine health concerns for screening and prevention, each reviewed and up to date or declined. Immunizations reviewed and up to date or declined. Labs ordered and reviewed. Risk factors for depression reviewed and negative. Hearing function and visual acuity are intact. ADLs screened and addressed as needed. Functional ability and level of safety reviewed and appropriate. Education, counseling and referrals performed based on assessed risks today. Patient provided with a copy of personalized plan for preventive services. - Follow up in one year or sooner if needed - Work on weight loss through diet and exercise  2. Acute deep vein thrombosis (DVT) of tibial vein of left lower extremity (HCC) - Continue Eliquis 5 mg BID  - Lipid panel; Future - CBC; Future - Comprehensive metabolic panel; Future  3. Prostate cancer screening  - PSA; Future  4. Mixed hyperlipidemia - Consider increase in statin  - Lipid panel; Future - CBC; Future - Comprehensive metabolic panel; Future  5. Need for influenza vaccination  - Flu vaccine trivalent PF, 6mos and older(Flulaval,Afluria,Fluarix,Fluzone)  Shirline Frees, NP

## 2023-01-26 NOTE — Patient Instructions (Addendum)
It was great seeing you today   We will follow up with you regarding your lab work   Please let me know if you need anything   Please work on weight loss through diet and exercise   

## 2023-01-26 NOTE — Progress Notes (Signed)
Date of birth verified by patient  Lab results given,Pt verbalized understanding   

## 2023-03-05 ENCOUNTER — Other Ambulatory Visit (HOSPITAL_COMMUNITY): Payer: Self-pay

## 2023-05-30 ENCOUNTER — Other Ambulatory Visit: Payer: Self-pay | Admitting: *Deleted

## 2023-05-30 ENCOUNTER — Telehealth: Payer: Self-pay | Admitting: *Deleted

## 2023-05-30 DIAGNOSIS — I82442 Acute embolism and thrombosis of left tibial vein: Secondary | ICD-10-CM

## 2023-05-30 DIAGNOSIS — M79662 Pain in left lower leg: Secondary | ICD-10-CM

## 2023-05-30 NOTE — Telephone Encounter (Signed)
 Received VM from pt.  RN attempt x1 to return call.  No answer unable to LVM due to VM being full.

## 2023-05-30 NOTE — Progress Notes (Signed)
 Received call from pt with complaint of pain and swelling behind left knee. Pt states he has not missed any doses of his Eliquis.  Per MD due to pt hx of DVT, verbal orders received to obtain VAS [place] and f/u in office.  Orders placed, pt notified and verbalized understanding.

## 2023-06-04 ENCOUNTER — Inpatient Hospital Stay: Attending: Hematology and Oncology | Admitting: Hematology and Oncology

## 2023-06-04 ENCOUNTER — Other Ambulatory Visit: Payer: Self-pay

## 2023-06-04 ENCOUNTER — Ambulatory Visit (HOSPITAL_COMMUNITY)
Admission: RE | Admit: 2023-06-04 | Discharge: 2023-06-04 | Disposition: A | Discharge status: Home / Self Care | Source: Ambulatory Visit | Attending: Hematology and Oncology | Admitting: Hematology and Oncology

## 2023-06-04 VITALS — BP 125/78 | HR 76 | Temp 99.0°F | Resp 17 | Ht 73.0 in | Wt 245.5 lb

## 2023-06-04 DIAGNOSIS — I82442 Acute embolism and thrombosis of left tibial vein: Secondary | ICD-10-CM | POA: Insufficient documentation

## 2023-06-04 DIAGNOSIS — Z91030 Bee allergy status: Secondary | ICD-10-CM | POA: Insufficient documentation

## 2023-06-04 DIAGNOSIS — M79662 Pain in left lower leg: Secondary | ICD-10-CM | POA: Insufficient documentation

## 2023-06-04 DIAGNOSIS — Z86718 Personal history of other venous thrombosis and embolism: Secondary | ICD-10-CM | POA: Diagnosis not present

## 2023-06-04 DIAGNOSIS — M7989 Other specified soft tissue disorders: Secondary | ICD-10-CM | POA: Insufficient documentation

## 2023-06-04 DIAGNOSIS — Z79899 Other long term (current) drug therapy: Secondary | ICD-10-CM | POA: Insufficient documentation

## 2023-06-04 DIAGNOSIS — Z7901 Long term (current) use of anticoagulants: Secondary | ICD-10-CM | POA: Diagnosis not present

## 2023-06-04 DIAGNOSIS — M25562 Pain in left knee: Secondary | ICD-10-CM | POA: Insufficient documentation

## 2023-06-04 NOTE — Assessment & Plan Note (Signed)
 09/15/2017: Acute DVT posterior tibial vein on the left Single unprovoked DVT   Current treatment: Xarelto indefinitely Xarelto adverse effect: Erectile dysfunction.   Patient does not have any inherited risk factors. February 2020: Lupus anticoagulant positive, repeat positive on 09/12/2018 06/04/2023: Vascular ultrasound:

## 2023-06-04 NOTE — Progress Notes (Signed)
 Referral faxed to [facility]. Fax confirmation received.

## 2023-06-04 NOTE — Progress Notes (Signed)
 Left lower extremity venous duplex has been completed. Preliminary results can be found in CV Proc through chart review.  Results were given to Dr. Pamelia Hoit.  06/04/23 11:31 AM Olen Cordial RVT

## 2023-06-04 NOTE — Progress Notes (Signed)
 Patient Care Team: Shirline Frees, NP as PCP - General (Family Medicine) Guest, Ashley Jacobs, MD (Internal Medicine)  DIAGNOSIS:  Encounter Diagnosis  Name Primary?   Acute deep vein thrombosis (DVT) of tibial vein of left lower extremity (HCC) Yes    SUMMARY OF ONCOLOGIC HISTORY: Oncology History   No history exists.    CHIEF COMPLIANT: Follow-up of history of DVT left leg  HISTORY OF PRESENT ILLNESS:  History of Present Illness The patient, a 58 year old with a history of blood clots, presents with left knee pain that started three weeks ago. The pain initially started on the side of the knee and then moved to the middle. The patient describes the pain as constant and worse when standing or walking. 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 The patient compares the pain to a previous experience with a blood clot that moved from his ankle to his knee. He denies any swelling or changes in the appearance of the knee.     ALLERGIES:  is allergic to bee venom.  MEDICATIONS:  Current Outpatient Medications  Medication Sig Dispense Refill   apixaban (ELIQUIS) 5 MG TABS tablet Take 1 tablet (5 mg total) by mouth 2 (two) times daily. 180 tablet 3   EPINEPHrine 0.3 mg/0.3 mL IJ SOAJ injection Inject 0.3 mg into the muscle as needed for anaphylaxis. 1 each 0   Multiple Vitamins-Minerals (MULTIVITAMIN WITH MINERALS) tablet Take 1 tablet by mouth daily.     rosuvastatin (CRESTOR) 10 MG tablet TAKE 1 TABLET BY MOUTH EVERY DAY 90 tablet 3   No current facility-administered medications for this visit.    PHYSICAL EXAMINATION: ECOG PERFORMANCE STATUS: 1 - Symptomatic but completely ambulatory  Vitals:   06/04/23 1213  BP: 125/78  Pulse: 76  Resp: 17  Temp: 99 F (37.2 C)  SpO2: 97%   Filed Weights   06/04/23 1213  Weight: 245 lb 8 oz (111.4 kg)    Physical Exam EXTREMITIES: No swelling in  the leg.  (exam performed in the presence of a chaperone)  LABORATORY DATA:  I have reviewed the data as listed    Latest Ref Rng & Units 01/26/2023    8:02 AM 04/08/2021    3:45 PM 11/23/2017    7:36 AM  CMP  Glucose 70 - 99 mg/dL 86  73  87   BUN 6 - 23 mg/dL 15  15  12    Creatinine 0.40 - 1.50 mg/dL 1.61  0.96  0.45   Sodium 135 - 145 mEq/L 139  139  139   Potassium 3.5 - 5.1 mEq/L 4.5  3.9  4.3   Chloride 96 - 112 mEq/L 104  104  103   CO2 19 - 32 mEq/L 29  25  26    Calcium 8.4 - 10.5 mg/dL 8.8  8.9  9.0   Total Protein 6.0 - 8.3 g/dL 6.5  6.9  6.8   Total Bilirubin 0.2 - 1.2 mg/dL 1.3  1.7  1.4   Alkaline Phos 39 - 117 U/L 44   40   AST 0 - 37 U/L 13  16  14    ALT 0 - 53 U/L 16  15  18      Lab Results  Component Value Date   WBC 4.1 01/26/2023   HGB 15.7 01/26/2023   HCT 47.7 01/26/2023   MCV 87.0 01/26/2023   PLT 145.0 (L) 01/26/2023  NEUTROABS 2,044 04/08/2021    ASSESSMENT & PLAN:  Acute deep vein thrombosis (DVT) of tibial vein of left lower extremity (HCC) 09/15/2017: Acute DVT posterior tibial vein on the left Single unprovoked DVT   Current treatment: Eliquis indefinitely   Patient does not have any inherited risk factors. February 2020: Lupus anticoagulant positive, repeat positive on 09/12/2018 06/04/2023: Vascular ultrasound: Negative for DVT  Left knee pain: Patient has not done anything to hurt himself and still has significant pain especially when he is weightbearing.  I recommended that he see an orthopedic specialist and we will request [facility] to see him. If he needs any knee injections, we will hold his Eliquis the day of the procedure as well as the day before and he can resume Eliquis the day after the procedure.  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   No orders of the defined types were placed in this encounter.  The patient has a good understanding of the  overall plan. he agrees with it. he will call with any problems that may develop before the next visit here. Total time spent: 30 mins including face to face time and time spent for planning, charting and co-ordination of care   Tamsen Meek, MD 06/04/23

## 2023-07-02 ENCOUNTER — Other Ambulatory Visit (HOSPITAL_COMMUNITY): Payer: Self-pay

## 2023-07-02 ENCOUNTER — Other Ambulatory Visit: Payer: Self-pay | Admitting: Hematology and Oncology

## 2023-07-02 MED ORDER — APIXABAN 5 MG PO TABS
5.0000 mg | ORAL_TABLET | Freq: Two times a day (BID) | ORAL | 3 refills | Status: AC
  Filled 2023-07-02: qty 60, 30d supply, fill #0
  Filled 2023-08-07: qty 60, 30d supply, fill #1
  Filled 2023-09-15 (×2): qty 60, 30d supply, fill #2
  Filled 2023-10-17: qty 60, 30d supply, fill #3
  Filled 2023-11-30: qty 60, 30d supply, fill #4
  Filled 2024-01-02: qty 60, 30d supply, fill #5
  Filled 2024-02-11: qty 60, 30d supply, fill #6
  Filled 2024-03-29: qty 60, 30d supply, fill #7

## 2023-07-03 ENCOUNTER — Other Ambulatory Visit (HOSPITAL_COMMUNITY): Payer: Self-pay

## 2023-07-03 ENCOUNTER — Other Ambulatory Visit: Payer: Self-pay | Admitting: Adult Health

## 2023-07-03 NOTE — Telephone Encounter (Signed)
 Copied from CRM [id number]. Topic: Clinical - Medication Refill >> Jul 03, 2023 12:15 PM Juluis Ok wrote: Most Recent Primary Care Visit:  Provider: [place]  Department: [facility]  Visit Type: PHYSICAL  Date: 01/26/2023  Medication: apixaban  (ELIQUIS ) 5 MG TABS tablet     Has the patient contacted their pharmacy? Yes (Agent: If no, request that the patient contact the pharmacy for the refill. If patient does not wish to contact the pharmacy document the reason why and proceed with request.) (Agent: If yes, when and what did the pharmacy advise?)  Is this the correct pharmacy for this prescription? Yes If no, delete pharmacy and type the correct one.  This is the patient's preferred pharmacy:    [facility] [address] [place] [address] Phone: [phone] Fax: [phone]   Has the prescription been filled recently? No  Is the patient out of the medication? Yes  Has the patient been seen for an appointment in the last year OR does the patient have an upcoming appointment? Yes  Can we respond through MyChart? No  Agent: Please be advised that Rx refills may take up to 3 business days. We ask that you follow-up with your pharmacy.

## 2023-07-19 ENCOUNTER — Other Ambulatory Visit: Payer: Self-pay | Admitting: Adult Health

## 2023-08-07 ENCOUNTER — Other Ambulatory Visit (HOSPITAL_COMMUNITY): Payer: Self-pay

## 2023-09-15 ENCOUNTER — Other Ambulatory Visit (HOSPITAL_COMMUNITY): Payer: Self-pay

## 2023-12-01 ENCOUNTER — Other Ambulatory Visit (HOSPITAL_COMMUNITY): Payer: Self-pay

## 2024-02-15 ENCOUNTER — Other Ambulatory Visit (HOSPITAL_COMMUNITY): Payer: Self-pay

## 2024-02-29 ENCOUNTER — Telehealth: Payer: Self-pay

## 2024-02-29 NOTE — Telephone Encounter (Signed)
 Patient notified of update  and verbalized understanding.

## 2024-02-29 NOTE — Telephone Encounter (Signed)
 Received surgical clearance form and advised pt that he need to come in for clearnace. Pt stated he has a CPE in [place] and wants to know if he can do it at that visit. Will send to PCP to see if this is appropriate.

## 2024-03-14 ENCOUNTER — Ambulatory Visit: Payer: Self-pay | Admitting: Adult Health

## 2024-03-14 ENCOUNTER — Encounter: Payer: Self-pay | Admitting: Adult Health

## 2024-03-14 ENCOUNTER — Ambulatory Visit: Admitting: Adult Health

## 2024-03-14 VITALS — BP 110/80 | HR 65 | Temp 98.1°F | Ht 73.0 in | Wt 247.0 lb

## 2024-03-14 DIAGNOSIS — Z0000 Encounter for general adult medical examination without abnormal findings: Secondary | ICD-10-CM

## 2024-03-14 DIAGNOSIS — I82542 Chronic embolism and thrombosis of left tibial vein: Secondary | ICD-10-CM

## 2024-03-14 DIAGNOSIS — R972 Elevated prostate specific antigen [PSA]: Secondary | ICD-10-CM

## 2024-03-14 DIAGNOSIS — Z01818 Encounter for other preprocedural examination: Secondary | ICD-10-CM

## 2024-03-14 DIAGNOSIS — Z125 Encounter for screening for malignant neoplasm of prostate: Secondary | ICD-10-CM

## 2024-03-14 DIAGNOSIS — Z23 Encounter for immunization: Secondary | ICD-10-CM

## 2024-03-14 DIAGNOSIS — E782 Mixed hyperlipidemia: Secondary | ICD-10-CM

## 2024-03-14 LAB — CBC
HCT: 46.8 % (ref 39.0–52.0)
Hemoglobin: 15.6 g/dL (ref 13.0–17.0)
MCHC: 33.4 g/dL (ref 30.0–36.0)
MCV: 85.3 fl (ref 78.0–100.0)
Platelets: 151 K/uL (ref 150.0–400.0)
RBC: 5.48 Mil/uL (ref 4.22–5.81)
RDW: 13.9 % (ref 11.5–15.5)
WBC: 4.9 K/uL (ref 4.0–10.5)

## 2024-03-14 LAB — URINALYSIS
Bilirubin Urine: NEGATIVE
Ketones, ur: NEGATIVE
Leukocytes,Ua: NEGATIVE
Nitrite: NEGATIVE
Specific Gravity, Urine: 1.025 (ref 1.000–1.030)
Total Protein, Urine: NEGATIVE
Urine Glucose: NEGATIVE
Urobilinogen, UA: 1 (ref 0.0–1.0)
pH: 6 (ref 5.0–8.0)

## 2024-03-14 LAB — LIPID PANEL
Cholesterol: 139 mg/dL (ref 28–200)
HDL: 46.3 mg/dL
LDL Cholesterol: 79 mg/dL (ref 10–99)
NonHDL: 92.69
Total CHOL/HDL Ratio: 3
Triglycerides: 69 mg/dL (ref 10.0–149.0)
VLDL: 13.8 mg/dL (ref 0.0–40.0)

## 2024-03-14 LAB — COMPREHENSIVE METABOLIC PANEL WITH GFR
ALT: 20 U/L (ref 3–53)
AST: 14 U/L (ref 5–37)
Albumin: 4.2 g/dL (ref 3.5–5.2)
Alkaline Phosphatase: 48 U/L (ref 39–117)
BUN: 15 mg/dL (ref 6–23)
CO2: 27 meq/L (ref 19–32)
Calcium: 8.6 mg/dL (ref 8.4–10.5)
Chloride: 104 meq/L (ref 96–112)
Creatinine, Ser: 1.21 mg/dL (ref 0.40–1.50)
GFR: 66.89 mL/min
Glucose, Bld: 90 mg/dL (ref 70–99)
Potassium: 4 meq/L (ref 3.5–5.1)
Sodium: 139 meq/L (ref 135–145)
Total Bilirubin: 1 mg/dL (ref 0.2–1.2)
Total Protein: 6.7 g/dL (ref 6.0–8.3)

## 2024-03-14 LAB — HEMOGLOBIN A1C: Hgb A1c MFr Bld: 5.8 % (ref 4.6–6.5)

## 2024-03-14 LAB — PROTIME-INR
INR: 1.2 ratio — ABNORMAL HIGH (ref 0.8–1.0)
Prothrombin Time: 12.4 s (ref 9.6–13.1)

## 2024-03-14 LAB — TSH: TSH: 5.17 u[IU]/mL (ref 0.35–5.50)

## 2024-03-14 LAB — PSA: PSA: 5.4 ng/mL — ABNORMAL HIGH (ref 0.10–4.00)

## 2024-03-14 NOTE — Patient Instructions (Signed)
 It was great seeing you today   We will follow up with you regarding your lab work   Please let me know if you need anything   Please stop Eliquis  two days prior to surgery

## 2024-03-14 NOTE — Progress Notes (Signed)
 "  Subjective:    Patient ID: Gregory Kiki Milissa Mickey., male    DOB: 09/27/1965, 59 y.o.   MRN: [id number]  HPI Patient presents for yearly preventative medicine examination. He is a pleasant 59 year old male who  has a past medical history of Chicken pox, DVT (deep venous thrombosis) (HCC) (09/2017), and Hyperlipidemia.  H/o DVT of tibial vein of left lower extremity -in 2019.  This was unprovoked.  Work-up by hematology showed lupus anticoagulant positive.  He is on Xarelto  for life. Denes bleeding issues, shortness of breath, chest pain, or Lower extremity edema   Hyperlipidemia - managed with crestor  10 mg daily. He denies myalgia or fatigue  Lab Results  Component Value Date   CHOL 148 01/26/2023   HDL 51.10 01/26/2023   LDLCALC 87 01/26/2023   TRIG 48.0 01/26/2023   CHOLHDL 3 01/26/2023    Acutely, he needs to have a preoperative clearance for left knee arthroscopy partial medial meniscectomy with chondroplasia. He reports that he is ready to have this done and be out of pain. He has gone through PT without results and his torn meniscus does not seem to be healing on its own.    All immunizations and health maintenance protocols were reviewed with the patient and needed orders were placed.  Appropriate screening laboratory values were ordered for the patient including screening of hyperlipidemia, renal function and hepatic function. If indicated by BPH, a PSA was ordered.  Medication reconciliation,  past medical history, social history, problem list and allergies were reviewed in detail with the patient  Goals were established with regard to weight loss, exercise, and  diet in compliance with medications Wt Readings from Last 3 Encounters:  03/14/24 247 lb (112 kg)  06/04/23 245 lb 8 oz (111.4 kg)  01/26/23 243 lb (110.2 kg)   Review of Systems  Constitutional: Negative.   HENT: Negative.    Eyes: Negative.   Respiratory: Negative.    Cardiovascular: Negative.    Gastrointestinal: Negative.   Endocrine: Negative.   Genitourinary: Negative.   Musculoskeletal:  Positive for arthralgias.  Skin: Negative.   Allergic/Immunologic: Negative.   Neurological: Negative.   Hematological: Negative.   Psychiatric/Behavioral: Negative.    All other systems reviewed and are negative.  Past Medical History:  Diagnosis Date   Chicken pox    DVT (deep venous thrombosis) (HCC) 09/2017   Hyperlipidemia     Social History   Socioeconomic History   Marital status: Married    Spouse name: Not on file   Number of children: Not on file   Years of education: Not on file   Highest education level: Not on file  Occupational History   Not on file  Tobacco Use   Smoking status: Never   Smokeless tobacco: Never  Vaping Use   Vaping status: Never Used  Substance and Sexual Activity   Alcohol use: No   Drug use: No   Sexual activity: Not on file  Other Topics Concern   Not on file  Social History Narrative   Married for 20 years    One daughters ( 48 years old) Lives local          Likes to ride his motorcycle and [employer] training.    Social Drivers of Health   Tobacco Use: Low Risk (03/14/2024)   Patient History    Smoking Tobacco Use: Never    Smokeless Tobacco Use: Never    Passive Exposure: Not on file  Financial Resource  Strain: Not on file  Food Insecurity: Not on file  Transportation Needs: Not on file  Physical Activity: Not on file  Stress: Not on file  Social Connections: Not on file  Intimate Partner Violence: Not on file  Depression (PHQ2-9): Low Risk (03/14/2024)   Depression (PHQ2-9)    PHQ-2 Score: 0  Alcohol Screen: Not on file  Housing: Not on file  Utilities: Not on file  Health Literacy: Not on file    Past Surgical History:  Procedure Laterality Date   APPENDECTOMY     CIRCUMCISION     newborn and at age 52    Family History  Problem Relation Age of Onset   Stroke Mother    Hypertension Mother    Allergies  Daughter    Colon cancer Neg Hx    Stomach cancer Neg Hx    Esophageal cancer Neg Hx     Allergies[1]  Medications Ordered Prior to Encounter[2]  BP 110/80   Pulse 65   Temp 98.1 F (36.7 C) (Oral)   Ht 6' 1 (1.854 m)   Wt 247 lb (112 kg)   SpO2 96%   BMI 32.59 kg/m       Objective:   Physical Exam Vitals and nursing note reviewed.  Constitutional:      General: He is not in acute distress.    Appearance: Normal appearance. He is not ill-appearing.  HENT:     Head: Normocephalic and atraumatic.     Right Ear: Tympanic membrane, ear canal and external ear normal. There is no impacted cerumen.     Left Ear: Tympanic membrane, ear canal and external ear normal. There is no impacted cerumen.     Nose: Nose normal. No congestion or rhinorrhea.     Mouth/Throat:     Mouth: Mucous membranes are moist.     Pharynx: Oropharynx is clear.  Eyes:     Extraocular Movements: Extraocular movements intact.     Conjunctiva/sclera: Conjunctivae normal.     Pupils: Pupils are equal, round, and reactive to light.  Neck:     Vascular: No carotid bruit.  Cardiovascular:     Rate and Rhythm: Normal rate and regular rhythm.     Pulses: Normal pulses.     Heart sounds: No murmur heard.    No friction rub. No gallop.  Pulmonary:     Effort: Pulmonary effort is normal.     Breath sounds: Normal breath sounds.  Abdominal:     General: Abdomen is flat. Bowel sounds are normal. There is no distension.     Palpations: Abdomen is soft. There is no mass.     Tenderness: There is no abdominal tenderness. There is no guarding or rebound.     Hernia: No hernia is present.  Musculoskeletal:        General: Normal range of motion.     Cervical back: Normal range of motion and neck supple.  Lymphadenopathy:     Cervical: No cervical adenopathy.  Skin:    General: Skin is warm and dry.     Capillary Refill: Capillary refill takes less than 2 seconds.  Neurological:     General: No focal  deficit present.     Mental Status: He is alert and oriented to person, place, and time.  Psychiatric:        Mood and Affect: Mood normal.        Behavior: Behavior normal.        Thought Content: Thought content  normal.        Judgment: Judgment normal.        Assessment & Plan:  1. Routine general medical examination at a health care facility (Primary) Today patient counseled on age appropriate routine health concerns for screening and prevention, each reviewed and up to date or declined. Immunizations reviewed and up to date or declined. Labs ordered and reviewed. Risk factors for depression reviewed and negative. Hearing function and visual acuity are intact. ADLs screened and addressed as needed. Functional ability and level of safety reviewed and appropriate. Education, counseling and referrals performed based on assessed risks today. Patient provided with a copy of personalized plan for preventive services.  2. 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 Mixed hyperlipidemia - Consider increase in statin  - Lipid panel; Future - TSH; Future - CBC; Future - Comprehensive metabolic panel with GFR; Future  4. Prostate cancer screening  - PSA; Future  5. Preoperative clearance - Low risk, will have him stop Eliquis  2 days prior to surgery.  - CBC; Future - Comprehensive metabolic panel with GFR; Future - EKG 12-Lead- SR with a rate of 64  - Urinalysis; Future - Protime-INR; Future - Hemoglobin A1c; Future  6. Need for influenza vaccination  - Flu vaccine trivalent PF, 6mos and older(Flulaval,Afluria,Fluarix,Fluzone)  7. Need for pneumococcal vaccine  - Pneumococcal conjugate vaccine 20-valent (Prevnar 20)  Gregory Clyatt, NP      [1]  Allergies Allergen Reactions   Bee Venom Swelling    Facial,  tongue and hand swelling  Yellow Jackets  [2]  Current Outpatient Medications on File Prior to Visit  Medication Sig Dispense Refill   apixaban  (ELIQUIS ) 5 MG TABS tablet Take 1 tablet (5 mg total) by mouth 2 (two) times daily. 180 tablet 3   EPINEPHrine  0.3 mg/0.3 mL IJ SOAJ injection Inject 0.3 mg into the muscle as needed for anaphylaxis. 1 each 0   Multiple Vitamins-Minerals (MULTIVITAMIN WITH MINERALS) tablet Take 1 tablet by mouth daily.     rosuvastatin  (CRESTOR ) 10 MG tablet TAKE 1 TABLET BY MOUTH EVERY DAY 90 tablet 3   No current facility-administered medications on file prior to visit.   "

## 2024-04-01 ENCOUNTER — Other Ambulatory Visit (HOSPITAL_COMMUNITY): Payer: Self-pay

## 2024-04-11 ENCOUNTER — Other Ambulatory Visit: Payer: Self-pay

## 2024-04-11 ENCOUNTER — Ambulatory Visit: Admission: RE | Admit: 2024-04-11 | Discharge: 2024-04-11 | Disposition: A | Source: Ambulatory Visit

## 2024-04-11 DIAGNOSIS — M79605 Pain in left leg: Secondary | ICD-10-CM

## 2024-06-03 ENCOUNTER — Inpatient Hospital Stay: Admitting: Hematology and Oncology

## 2025-03-17 ENCOUNTER — Encounter: Admitting: Adult Health
